# Patient Record
Sex: Female | Born: 1990 | Race: Black or African American | Hispanic: No | Marital: Single | Smoking: Current every day smoker
Health system: Southern US, Community
[De-identification: ages and names within clinical notes are randomized; demographics above are authoritative.]

## PROBLEM LIST (undated history)

## (undated) ENCOUNTER — Inpatient Hospital Stay (HOSPITAL_COMMUNITY): Payer: Self-pay

## (undated) DIAGNOSIS — Z206 Contact with and (suspected) exposure to human immunodeficiency virus [HIV]: Secondary | ICD-10-CM

## (undated) DIAGNOSIS — A6000 Herpesviral infection of urogenital system, unspecified: Secondary | ICD-10-CM

## (undated) DIAGNOSIS — D649 Anemia, unspecified: Secondary | ICD-10-CM

## (undated) HISTORY — PX: NO PAST SURGERIES: SHX2092

---
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
## 2009-03-11 IMAGING — US US OB TRANSVAGINAL MODIFY
1 series · 14 of 28 positions shown · non-contrast
Comparison: none

CLINICAL DATA: Abdominal pain. Quantitative beta HCG 889.  Positive
urine pregnancy test.  Last menstrual period 05/05/2008.
Gestational age by last menstrual period 4 weeks 0 days.

OBSTETRIC <14 WK US AND TRANSVAGINAL OB US
TECHNIQUE: Both transabdominal and transvaginal ultrasound
examinations were performed for complete evaluation of the
gestation as well as the maternal uterus, adnexal regions, and
pelvic cul-de-sac.

[Series 1: us ob comp less 14 wks · 14 of 38 slices shown]
[im 2/38]
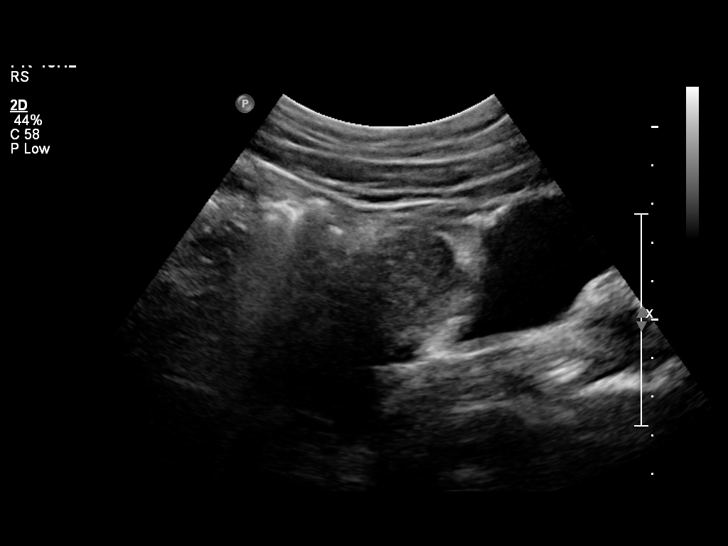
[im 5/38]
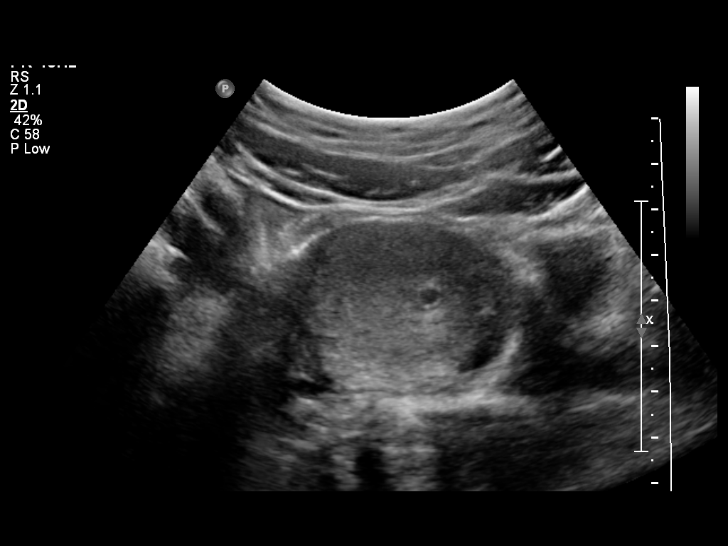
[im 7/38]
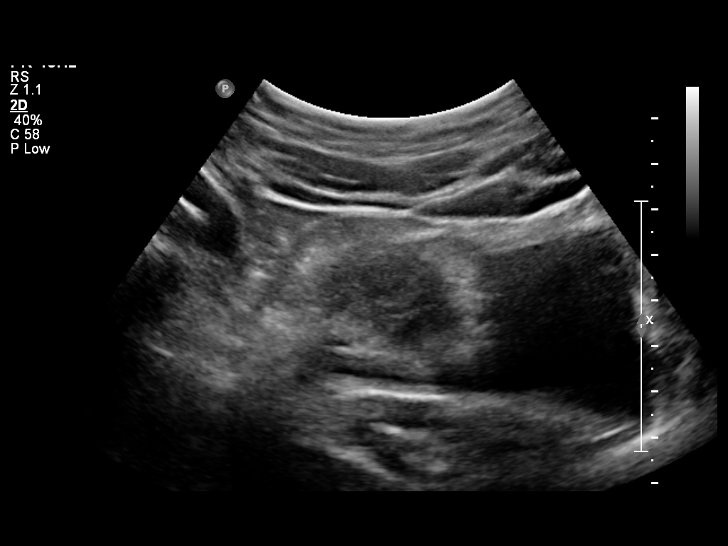
[im 10/38]
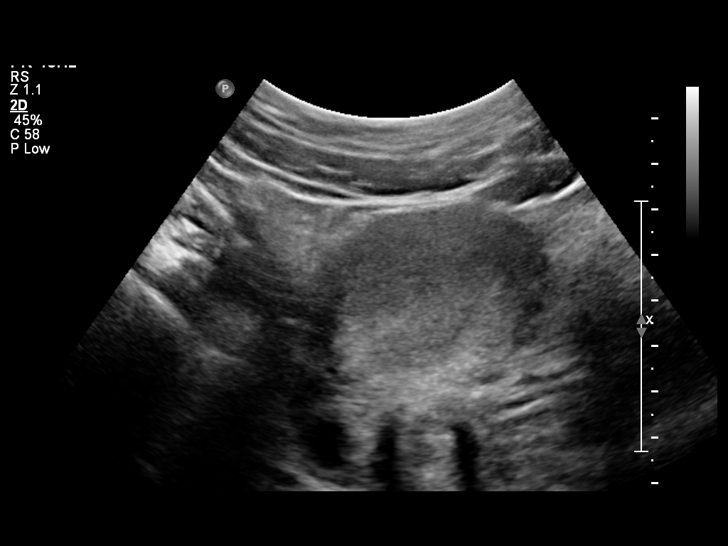
[im 13/38]
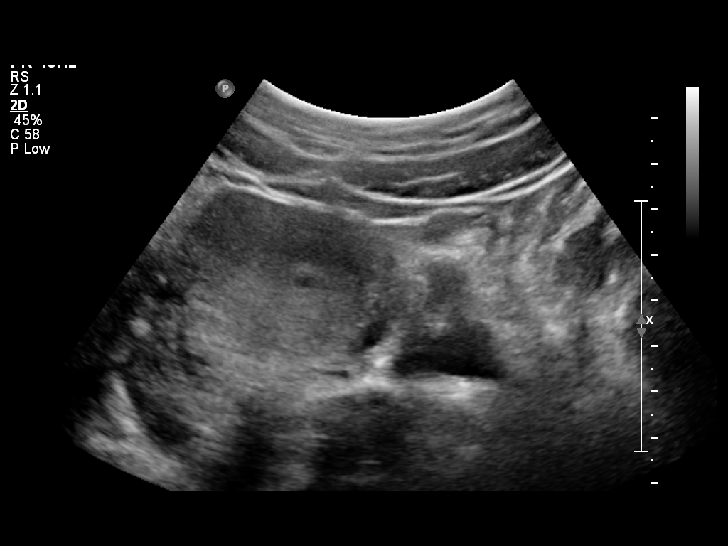
[im 16/38]
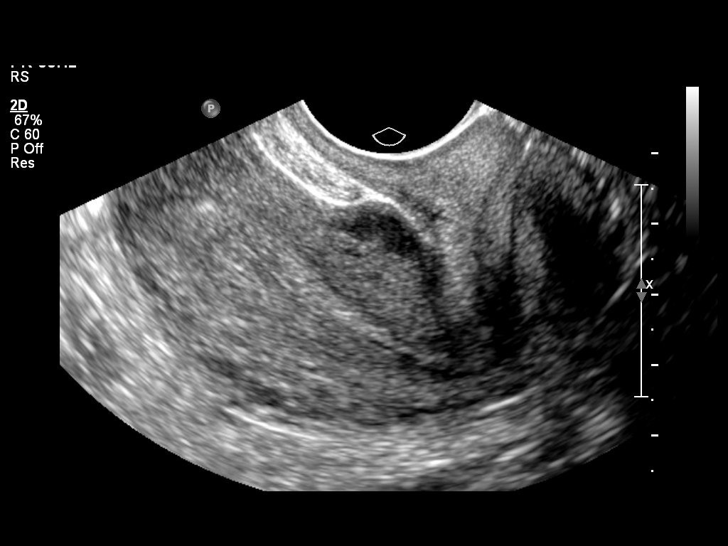
[im 18/38]
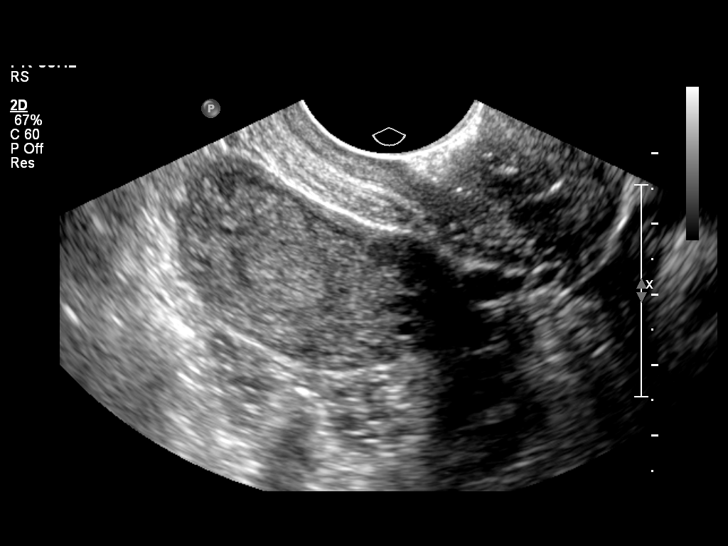
[im 21/38]
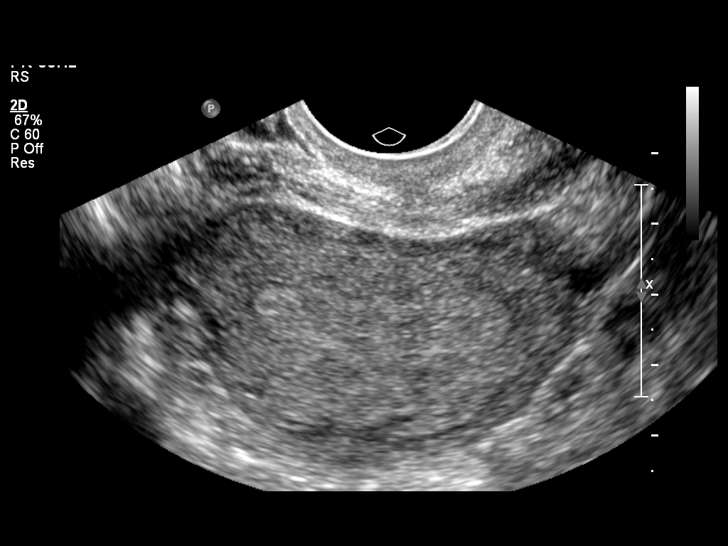
[im 24/38]
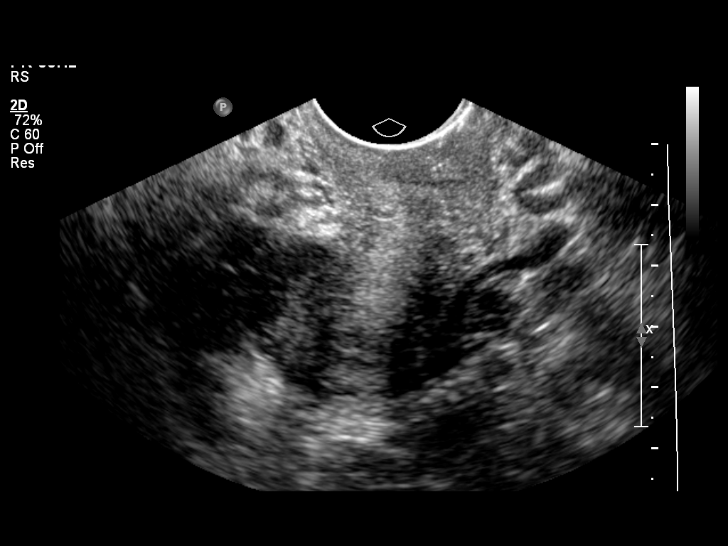
[im 27/38]
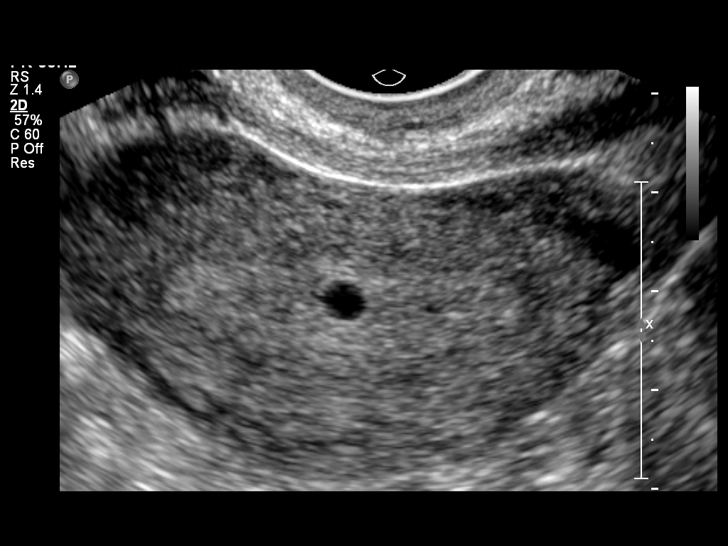
[im 29/38]
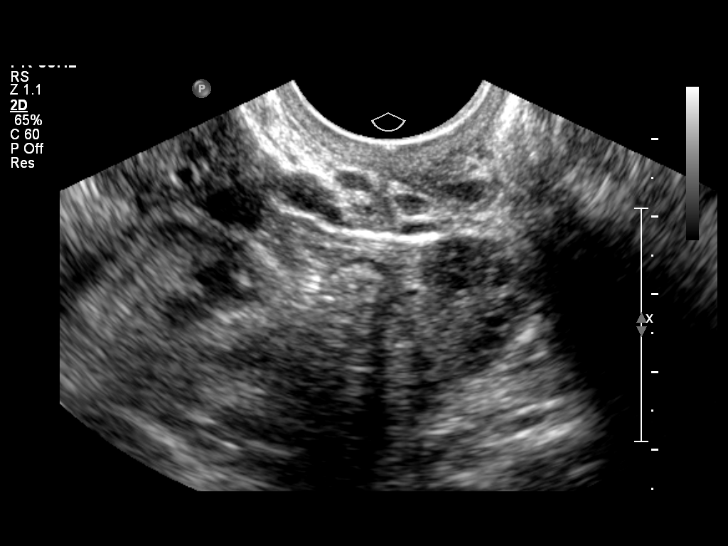
[im 32/38]
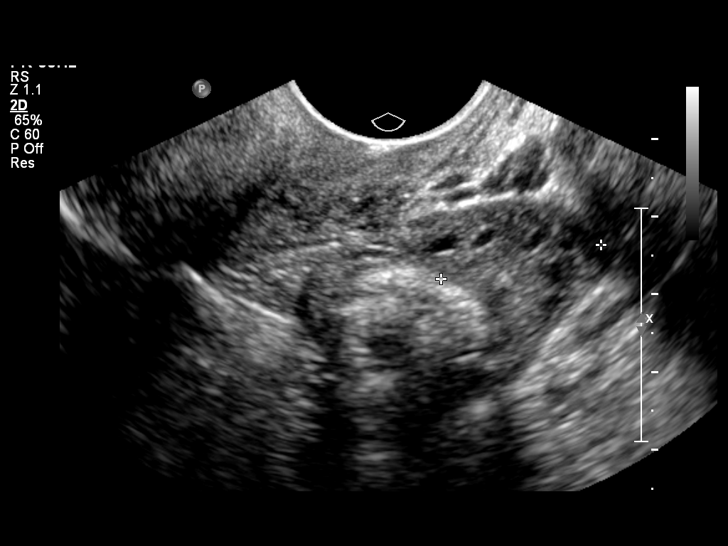
[im 35/38]
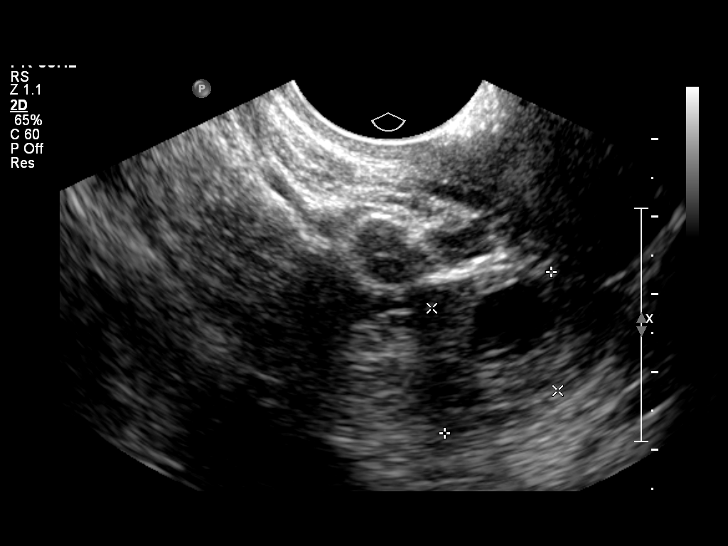
[im 38/38]
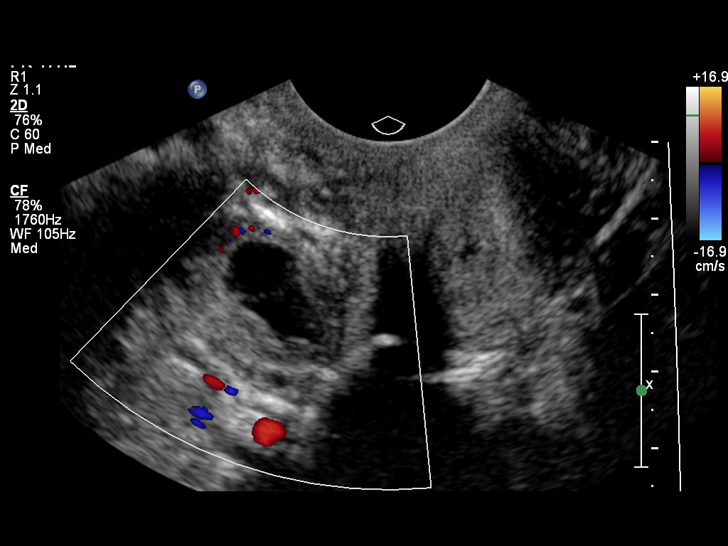

[14 of 28 positions shown; findings below may reference images not displayed]

FINDINGS: Single intrauterine gestational sac is identified.  No yolk sac,
embryo, or cardiac activity are identified.  The gestational sac
measures 3.9 mm, corresponding with 4 weeks 6 days.  At this small
size, it is too early to evaluate the needle characteristic or
viability.  No subchorionic hemorrhage.  Right ovary and left ovary
appear within normal limits.  Right ovarian corpus luteum cyst.  No
free fluid.
IMPRESSION: 1.  Tiny endometrial cyst may represent early gestational sac.  At
this quantitative beta HCG and with the ultrasound findings the
differential considerations are spontaneous abortion, early IUP
(normal or abnormal) or ectopic.  Follow-up is recommended.

## 2009-04-12 ENCOUNTER — Inpatient Hospital Stay (HOSPITAL_COMMUNITY): Admission: AD | Admit: 2009-04-12 | Discharge: 2009-04-12 | Payer: Self-pay | Admitting: Family Medicine

## 2009-04-12 ENCOUNTER — Ambulatory Visit: Payer: Self-pay | Admitting: Advanced Practice Midwife

## 2009-05-30 ENCOUNTER — Emergency Department (HOSPITAL_COMMUNITY): Admission: EM | Admit: 2009-05-30 | Discharge: 2009-05-31 | Payer: Self-pay | Admitting: Emergency Medicine

## 2009-06-13 ENCOUNTER — Inpatient Hospital Stay (HOSPITAL_COMMUNITY): Admission: AD | Admit: 2009-06-13 | Discharge: 2009-06-13 | Payer: Self-pay | Admitting: Obstetrics & Gynecology

## 2009-06-16 ENCOUNTER — Emergency Department (HOSPITAL_COMMUNITY): Admission: EM | Admit: 2009-06-16 | Discharge: 2009-06-16 | Payer: Self-pay | Admitting: Family Medicine

## 2009-06-16 ENCOUNTER — Inpatient Hospital Stay (HOSPITAL_COMMUNITY): Admission: AD | Admit: 2009-06-16 | Discharge: 2009-06-16 | Payer: Self-pay | Admitting: Obstetrics

## 2009-06-16 ENCOUNTER — Ambulatory Visit: Payer: Self-pay | Admitting: Obstetrics and Gynecology

## 2009-06-29 ENCOUNTER — Ambulatory Visit (HOSPITAL_COMMUNITY): Admission: RE | Admit: 2009-06-29 | Discharge: 2009-06-29 | Payer: Self-pay | Admitting: Obstetrics

## 2009-07-14 ENCOUNTER — Inpatient Hospital Stay (HOSPITAL_COMMUNITY): Admission: AD | Admit: 2009-07-14 | Discharge: 2009-07-14 | Payer: Self-pay | Admitting: Obstetrics

## 2010-04-07 IMAGING — US US OB DETAIL+14 WK
1 series · 14 of 28 positions shown · non-contrast
Comparison: none

OBSTETRICAL ULTRASOUND:
 This ultrasound exam was performed in the [HOSPITAL] Ultrasound Department.  The OB US report was generated in the AS system, and faxed to the ordering physician.  This report is also available in [HOSPITAL]?s [employer] and in [REDACTED] PACS.

[Series 1: us ob detail +14 wk · 0.16mm/px · 57 acquisitions, 14 frames shown]
[im 3/57]
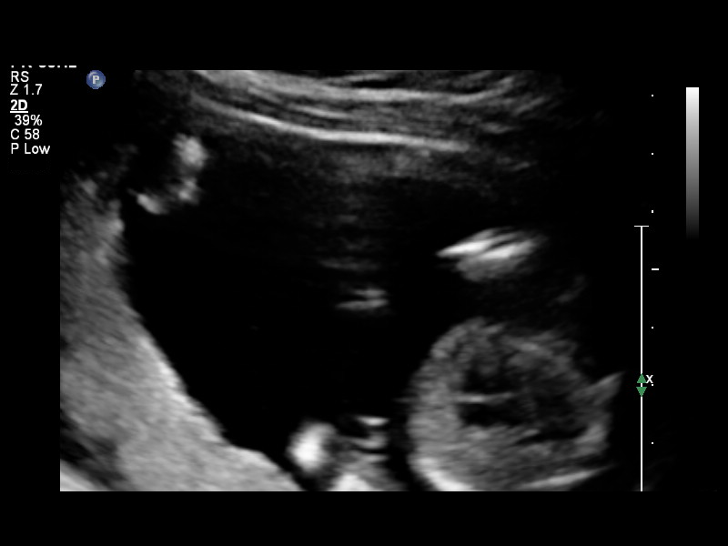
[im 7/57]
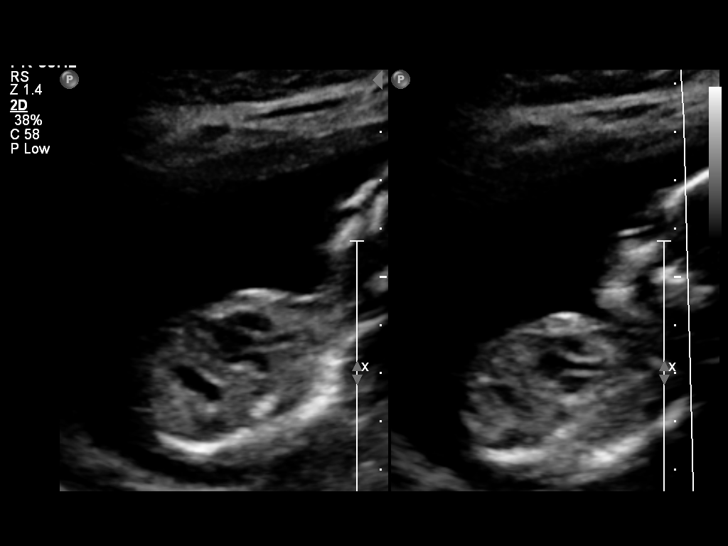
[im 11/57]
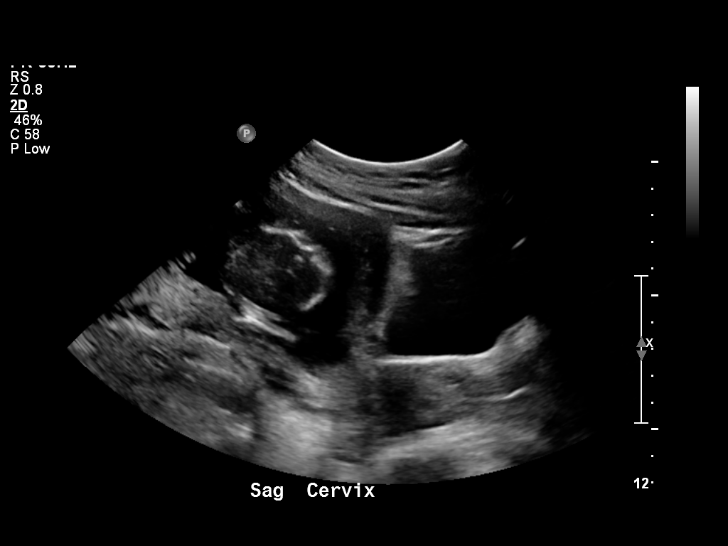
[im 15/57]
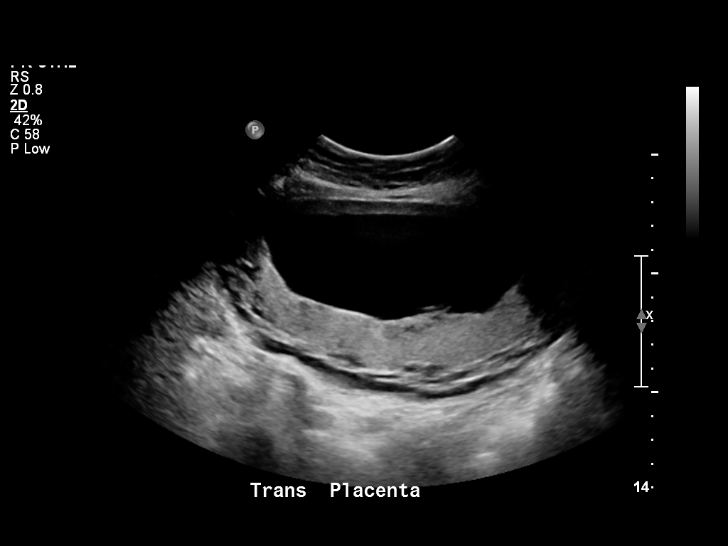
[im 19/57]
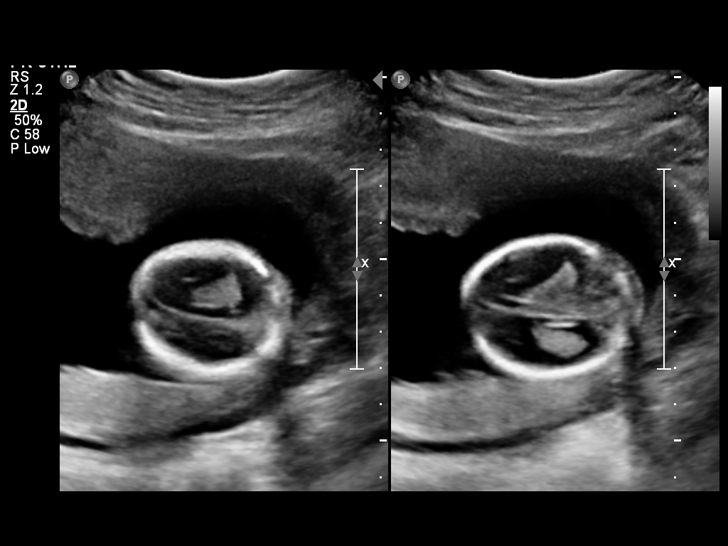
[im 23/57]
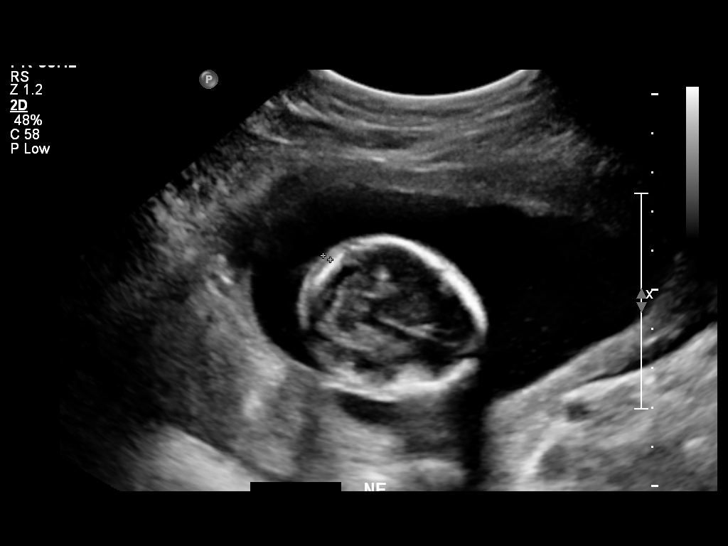
[im 27/57]
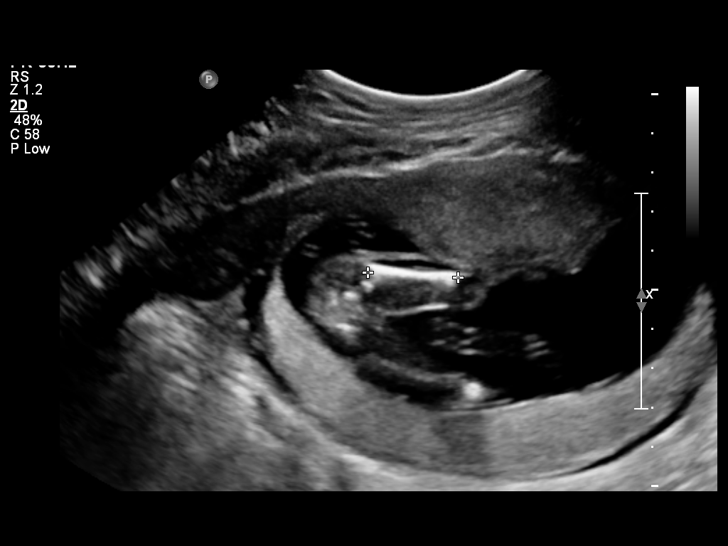
[im 32/57]
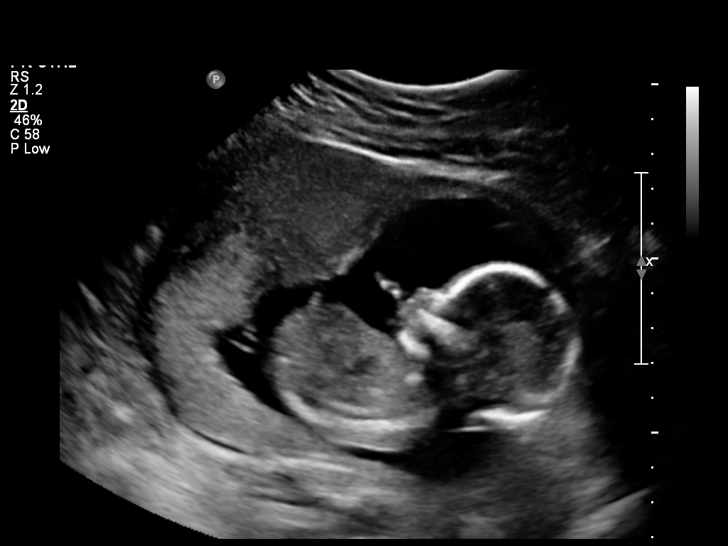
[im 36/57]
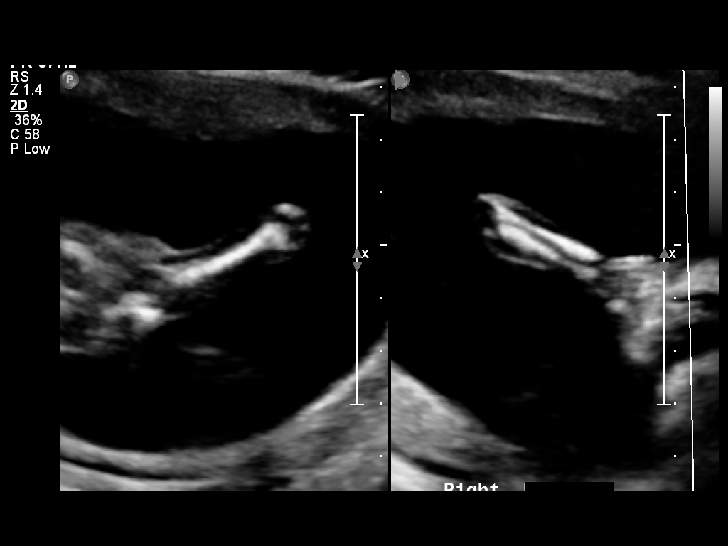
[im 40/57]
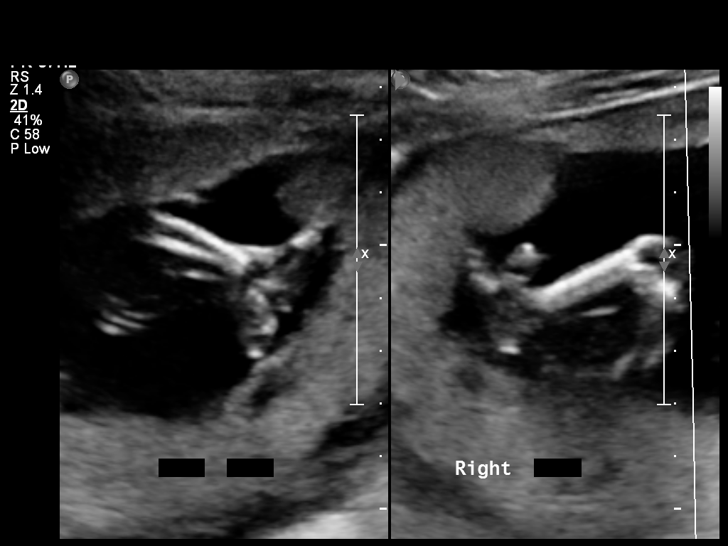
[im 44/57]
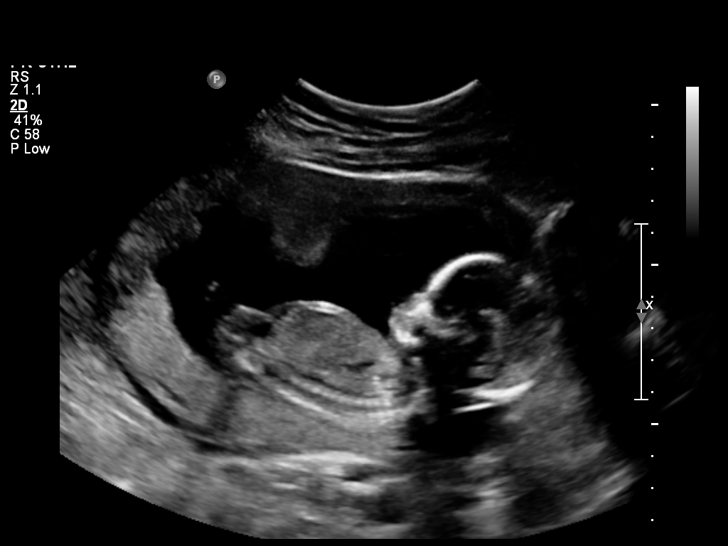
[im 48/57]
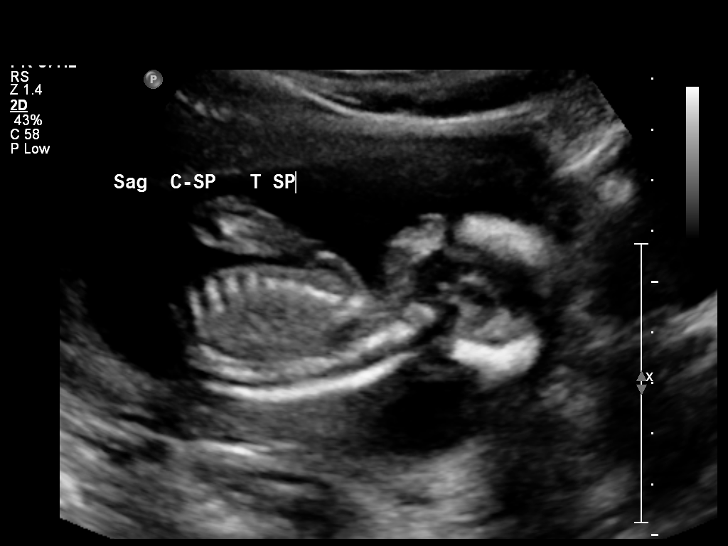
[im 52/57]
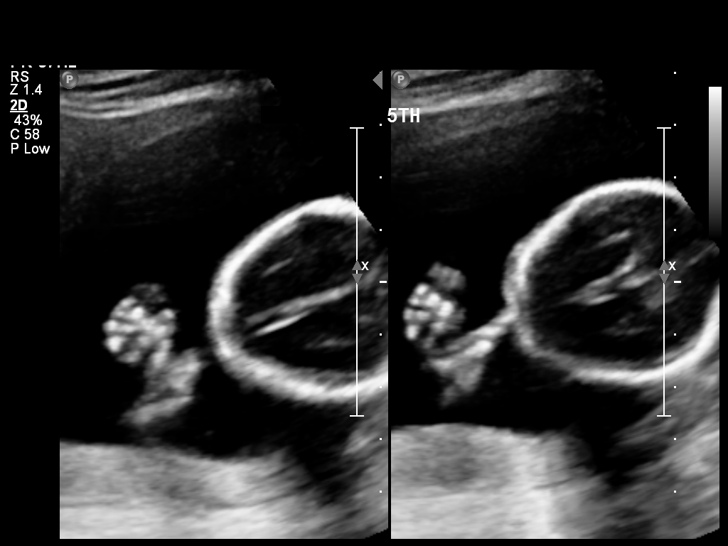
[im 57/57]
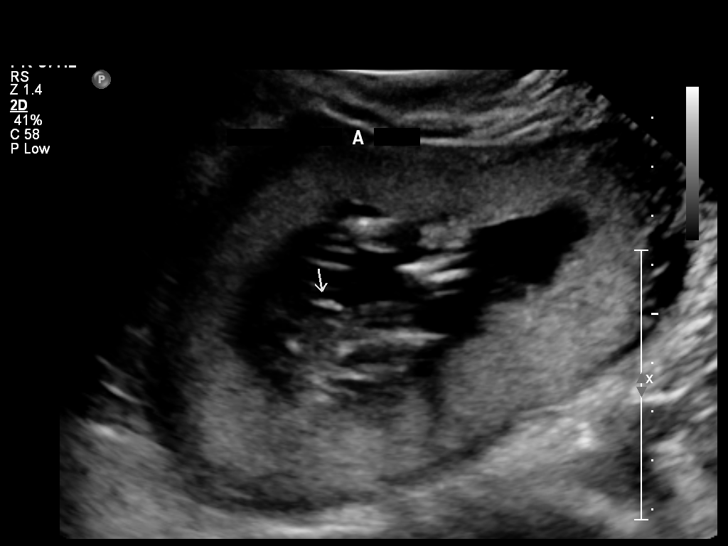

[14 of 28 positions shown; findings below may reference images not displayed]

IMPRESSION: See AS Obstetric US report.

## 2010-07-04 LAB — URINALYSIS, ROUTINE W REFLEX MICROSCOPIC
Bilirubin Urine: NEGATIVE
Ketones, ur: NEGATIVE mg/dL
Nitrite: NEGATIVE
Protein, ur: NEGATIVE mg/dL
Specific Gravity, Urine: 1.012 (ref 1.005–1.030)
Urobilinogen, UA: 0.2 mg/dL (ref 0.0–1.0)

## 2010-07-04 LAB — HERPES SIMPLEX VIRUS CULTURE: Culture: DETECTED

## 2010-07-04 LAB — WET PREP, GENITAL

## 2010-07-04 LAB — GC/CHLAMYDIA PROBE AMP, GENITAL
Chlamydia, DNA Probe: NEGATIVE
Chlamydia, DNA Probe: NEGATIVE
GC Probe Amp, Genital: NEGATIVE

## 2010-07-04 LAB — URINE MICROSCOPIC-ADD ON

## 2010-07-04 LAB — POCT PREGNANCY, URINE: Preg Test, Ur: POSITIVE

## 2010-07-04 LAB — HCG, QUANTITATIVE, PREGNANCY: hCG, Beta Chain, Quant, S: >200000 m[IU]/mL — ABNORMAL HIGH (ref <5)

## 2010-07-04 LAB — RPR: RPR Ser Ql: NONREACTIVE

## 2010-07-08 LAB — POCT URINALYSIS DIP (DEVICE)
Bilirubin Urine: NEGATIVE
Glucose, UA: 250 mg/dL — AB
Hgb urine dipstick: NEGATIVE
Nitrite: NEGATIVE
Urobilinogen, UA: 0.2 mg/dL (ref 0.0–1.0)

## 2010-07-08 LAB — GLUCOSE, CAPILLARY: Glucose-Capillary: 99 mg/dL (ref 70–99)

## 2010-07-08 LAB — GC/CHLAMYDIA PROBE AMP, GENITAL
Chlamydia, DNA Probe: NEGATIVE
GC Probe Amp, Genital: NEGATIVE

## 2010-07-08 LAB — URINALYSIS, ROUTINE W REFLEX MICROSCOPIC
Bilirubin Urine: NEGATIVE
Glucose, UA: NEGATIVE mg/dL
Hgb urine dipstick: NEGATIVE
Ketones, ur: NEGATIVE mg/dL
Protein, ur: NEGATIVE mg/dL
Specific Gravity, Urine: 1.01 (ref 1.005–1.030)
Urobilinogen, UA: 0.2 mg/dL (ref 0.0–1.0)
pH: 5.5 (ref 5.0–8.0)

## 2010-07-08 LAB — WET PREP, GENITAL
Clue Cells Wet Prep HPF POC: NONE SEEN
Trich, Wet Prep: NONE SEEN

## 2010-07-08 LAB — URINE MICROSCOPIC-ADD ON: RBC / HPF: NONE SEEN RBC/hpf (ref <3)

## 2010-07-16 LAB — URINALYSIS, ROUTINE W REFLEX MICROSCOPIC
Bilirubin Urine: NEGATIVE
Glucose, UA: NEGATIVE mg/dL
Hgb urine dipstick: NEGATIVE
Specific Gravity, Urine: <1.005 — ABNORMAL LOW (ref 1.005–1.030)
pH: 7 (ref 5.0–8.0)

## 2010-07-16 LAB — WET PREP, GENITAL
Trich, Wet Prep: NONE SEEN
Yeast Wet Prep HPF POC: NONE SEEN

## 2010-07-16 LAB — POCT PREGNANCY, URINE: Preg Test, Ur: POSITIVE

## 2010-07-16 LAB — GC/CHLAMYDIA PROBE AMP, GENITAL
Chlamydia, DNA Probe: NEGATIVE
GC Probe Amp, Genital: NEGATIVE

## 2010-07-26 LAB — URINALYSIS, ROUTINE W REFLEX MICROSCOPIC
Ketones, ur: NEGATIVE mg/dL
Nitrite: NEGATIVE
Specific Gravity, Urine: <1.005 — ABNORMAL LOW (ref 1.005–1.030)
pH: 6.5 (ref 5.0–8.0)

## 2010-07-26 LAB — POCT PREGNANCY, URINE: Preg Test, Ur: NEGATIVE

## 2010-07-26 LAB — HCG, QUANTITATIVE, PREGNANCY: hCG, Beta Chain, Quant, S: <2 m[IU]/mL (ref <5)

## 2010-07-30 LAB — URINALYSIS, ROUTINE W REFLEX MICROSCOPIC
Hgb urine dipstick: NEGATIVE
Protein, ur: NEGATIVE mg/dL
Urobilinogen, UA: 0.2 mg/dL (ref 0.0–1.0)

## 2010-07-31 LAB — URINALYSIS, ROUTINE W REFLEX MICROSCOPIC
Bilirubin Urine: NEGATIVE
Glucose, UA: NEGATIVE mg/dL
Glucose, UA: NEGATIVE mg/dL
Hgb urine dipstick: NEGATIVE
Ketones, ur: NEGATIVE mg/dL
Protein, ur: NEGATIVE mg/dL
Specific Gravity, Urine: 1.015 (ref 1.005–1.030)
Urobilinogen, UA: 0.2 mg/dL (ref 0.0–1.0)
pH: 6 (ref 5.0–8.0)

## 2010-07-31 LAB — RH IMMUNE GLOBULIN WORKUP (NOT WOMEN'S HOSP): Antibody Screen: NEGATIVE

## 2010-07-31 LAB — CBC
Hemoglobin: 12.9 g/dL (ref 12.0–15.0)
Hemoglobin: 13.3 g/dL (ref 12.0–15.0)
MCHC: 33 g/dL (ref 30.0–36.0)
RBC: 4.65 MIL/uL (ref 3.87–5.11)
RBC: 4.82 MIL/uL (ref 3.87–5.11)
RDW: 14.1 % (ref 11.5–15.5)
WBC: 8.8 10*3/uL (ref 4.0–10.5)

## 2010-07-31 LAB — GC/CHLAMYDIA PROBE AMP, GENITAL
Chlamydia, DNA Probe: NEGATIVE
GC Probe Amp, Genital: NEGATIVE

## 2010-07-31 LAB — WET PREP, GENITAL: Trich, Wet Prep: NONE SEEN

## 2010-07-31 LAB — URINE MICROSCOPIC-ADD ON

## 2010-07-31 LAB — ABO/RH
ABO/RH(D): B NEG
Weak D: NEGATIVE

## 2010-07-31 LAB — HCG, QUANTITATIVE, PREGNANCY
hCG, Beta Chain, Quant, S: 595 m[IU]/mL — ABNORMAL HIGH (ref <5)
hCG, Beta Chain, Quant, S: 889 m[IU]/mL — ABNORMAL HIGH (ref <5)

## 2011-01-15 LAB — URINALYSIS, ROUTINE W REFLEX MICROSCOPIC
Bilirubin Urine: NEGATIVE
Ketones, ur: NEGATIVE
Leukocytes, UA: NEGATIVE
Nitrite: NEGATIVE
Protein, ur: NEGATIVE
Urobilinogen, UA: 0.2
pH: 6.5

## 2011-01-15 LAB — WET PREP, GENITAL

## 2011-01-15 LAB — POCT PREGNANCY, URINE
Preg Test, Ur: NEGATIVE
Preg Test, Ur: NEGATIVE

## 2015-12-15 DIAGNOSIS — Z206 Contact with and (suspected) exposure to human immunodeficiency virus [HIV]: Secondary | ICD-10-CM

## 2015-12-15 HISTORY — DX: Contact with and (suspected) exposure to human immunodeficiency virus (hiv): Z20.6

## 2015-12-23 ENCOUNTER — Emergency Department (HOSPITAL_COMMUNITY)
Admission: EM | Admit: 2015-12-23 | Discharge: 2015-12-23 | Discharge status: Against Medical Advice | Payer: Medicaid Other | Attending: Dermatology | Admitting: Dermatology

## 2015-12-23 ENCOUNTER — Encounter (HOSPITAL_COMMUNITY): Payer: Self-pay | Admitting: Emergency Medicine

## 2015-12-23 ENCOUNTER — Emergency Department (HOSPITAL_COMMUNITY): Payer: Medicaid Other

## 2015-12-23 DIAGNOSIS — R112 Nausea with vomiting, unspecified: Secondary | ICD-10-CM | POA: Insufficient documentation

## 2015-12-23 DIAGNOSIS — F17200 Nicotine dependence, unspecified, uncomplicated: Secondary | ICD-10-CM | POA: Insufficient documentation

## 2015-12-23 DIAGNOSIS — Z5321 Procedure and treatment not carried out due to patient leaving prior to being seen by health care provider: Secondary | ICD-10-CM | POA: Insufficient documentation

## 2015-12-23 HISTORY — DX: Herpesviral infection of urogenital system, unspecified: A60.00

## 2015-12-23 LAB — COMPREHENSIVE METABOLIC PANEL
ALBUMIN: 4 g/dL (ref 3.5–5.0)
ALT: 14 U/L (ref 14–54)
ANION GAP: 10 (ref 5–15)
AST: 21 U/L (ref 15–41)
Alkaline Phosphatase: 46 U/L (ref 38–126)
BILIRUBIN TOTAL: 0.5 mg/dL (ref 0.3–1.2)
CHLORIDE: 101 mmol/L (ref 101–111)
CO2: 26 mmol/L (ref 22–32)
Calcium: 10 mg/dL (ref 8.9–10.3)
Creatinine, Ser: 0.74 mg/dL (ref 0.44–1.00)
GFR calc Af Amer: >60 mL/min (ref >60)
GFR calc non Af Amer: >60 mL/min (ref >60)
GLUCOSE: 88 mg/dL (ref 65–99)
POTASSIUM: 4 mmol/L (ref 3.5–5.1)
SODIUM: 137 mmol/L (ref 135–145)
TOTAL PROTEIN: 7.1 g/dL (ref 6.5–8.1)

## 2015-12-23 LAB — CBC
HEMATOCRIT: 40 % (ref 36.0–46.0)
HEMOGLOBIN: 13.7 g/dL (ref 12.0–15.0)
MCH: 27.3 pg (ref 26.0–34.0)
MCHC: 34.3 g/dL (ref 30.0–36.0)
MCV: 79.7 fL (ref 78.0–100.0)
Platelets: 334 10*3/uL (ref 150–400)
RBC: 5.02 MIL/uL (ref 3.87–5.11)
RDW: 13.8 % (ref 11.5–15.5)
WBC: 10.1 10*3/uL (ref 4.0–10.5)

## 2015-12-23 LAB — URINALYSIS, ROUTINE W REFLEX MICROSCOPIC
BILIRUBIN URINE: NEGATIVE
GLUCOSE, UA: NEGATIVE mg/dL
Hgb urine dipstick: NEGATIVE
KETONES UR: 15 mg/dL — AB
Leukocytes, UA: NEGATIVE
NITRITE: NEGATIVE
PH: 7 (ref 5.0–8.0)
Protein, ur: NEGATIVE mg/dL
SPECIFIC GRAVITY, URINE: 1.013 (ref 1.005–1.030)

## 2015-12-23 LAB — I-STAT TROPONIN, ED: TROPONIN I, POC: 0 ng/mL (ref 0.00–0.08)

## 2015-12-23 LAB — LIPASE, BLOOD: Lipase: 19 U/L (ref 11–51)

## 2015-12-23 LAB — POC URINE PREG, ED: Preg Test, Ur: POSITIVE — AB

## 2015-12-23 NOTE — ED Triage Notes (Signed)
Pt being incredibly disruptively in the waiting room. Pt becoming verbally aggressive, yelling at staff, cursing, threatening to run into the acute side of ED and find a doctor. Staff and security attempted therapeutic communication to calm pt down. Pt continued to yell and disrupt. Pt eventually escorted out of ED on her own free will with [employer] following behind.

## 2015-12-23 NOTE — ED Triage Notes (Signed)
Patient with nausea and vomiting for the last week.  Patient states that she might have a chance of being pregnant.  She states that she is dizzy sitting up and having a hard time keeping anything down.  She states that she have tightness in her abdomen and burning in her chest.

## 2016-01-19 ENCOUNTER — Encounter (HOSPITAL_COMMUNITY): Payer: Self-pay | Admitting: *Deleted

## 2016-01-19 ENCOUNTER — Inpatient Hospital Stay (HOSPITAL_COMMUNITY)
Admission: AD | Admit: 2016-01-19 | Discharge: 2016-01-19 | Disposition: A | Discharge status: Home / Self Care | Payer: Medicaid Other | Source: Ambulatory Visit | Attending: Obstetrics & Gynecology | Admitting: Obstetrics & Gynecology

## 2016-01-19 DIAGNOSIS — O219 Vomiting of pregnancy, unspecified: Secondary | ICD-10-CM

## 2016-01-19 DIAGNOSIS — F17290 Nicotine dependence, other tobacco product, uncomplicated: Secondary | ICD-10-CM | POA: Insufficient documentation

## 2016-01-19 DIAGNOSIS — Z3A16 16 weeks gestation of pregnancy: Secondary | ICD-10-CM | POA: Insufficient documentation

## 2016-01-19 DIAGNOSIS — O23592 Infection of other part of genital tract in pregnancy, second trimester: Secondary | ICD-10-CM | POA: Insufficient documentation

## 2016-01-19 DIAGNOSIS — O99332 Smoking (tobacco) complicating pregnancy, second trimester: Secondary | ICD-10-CM | POA: Diagnosis not present

## 2016-01-19 DIAGNOSIS — R1030 Lower abdominal pain, unspecified: Secondary | ICD-10-CM | POA: Diagnosis present

## 2016-01-19 DIAGNOSIS — O210 Mild hyperemesis gravidarum: Secondary | ICD-10-CM | POA: Insufficient documentation

## 2016-01-19 DIAGNOSIS — N760 Acute vaginitis: Secondary | ICD-10-CM

## 2016-01-19 HISTORY — DX: Contact with and (suspected) exposure to human immunodeficiency virus (hiv): Z20.6

## 2016-01-19 HISTORY — DX: Anemia, unspecified: D64.9

## 2016-01-19 LAB — URINALYSIS, ROUTINE W REFLEX MICROSCOPIC
BILIRUBIN URINE: NEGATIVE
GLUCOSE, UA: NEGATIVE mg/dL
HGB URINE DIPSTICK: NEGATIVE
Ketones, ur: 40 mg/dL — AB
Leukocytes, UA: NEGATIVE
Nitrite: NEGATIVE
PH: 6 (ref 5.0–8.0)
Protein, ur: NEGATIVE mg/dL
SPECIFIC GRAVITY, URINE: 1.01 (ref 1.005–1.030)

## 2016-01-19 LAB — WET PREP, GENITAL
Clue Cells Wet Prep HPF POC: NONE SEEN
SPERM: NONE SEEN
Trich, Wet Prep: NONE SEEN
Yeast Wet Prep HPF POC: NONE SEEN

## 2016-01-19 MED ORDER — ONDANSETRON 4 MG PO TBDP: 4.0000 mg | ORAL_TABLET | Freq: Four times a day (QID) | ORAL | 2 refills | Status: DC | PRN

## 2016-01-19 MED ORDER — PREPLUS 27-1 MG PO TABS: 1.0000 | ORAL_TABLET | Freq: Every day | ORAL | 13 refills | Status: DC

## 2016-01-19 MED ORDER — ONDANSETRON 4 MG PO TBDP
4.0000 mg | ORAL_TABLET | Freq: Once | ORAL | Status: AC
  Administered 2016-01-19: 4 mg via ORAL
  Filled 2016-01-19: qty 1

## 2016-01-19 NOTE — Progress Notes (Signed)
Written and verbal d/c instructions given and understanding voiced. 

## 2016-01-19 NOTE — MAU Note (Signed)
Pt states she has been having abd pain vomiting/diarrhea x 1.5 months.  Pt states she has been having unprotected sex with partner who just found out he is HIV positive.  Pt wanted to be checked out.  Vaginal burning/itching with yellowish chunky discharge with bad smell for 2 weeks.  No bleeding.

## 2016-01-19 NOTE — Discharge Instructions (Signed)
Morning Sickness Morning sickness is when you feel sick to your stomach (nauseous) during pregnancy. This nauseous feeling may or may not come with vomiting. It often occurs in the morning but can be a problem any time of day. Morning sickness is most common during the first trimester, but it may continue throughout pregnancy. While morning sickness is unpleasant, it is usually harmless unless you develop severe and continual vomiting (hyperemesis gravidarum). This condition requires more intense treatment.  CAUSES  The cause of morning sickness is not completely known but seems to be related to normal hormonal changes that occur in pregnancy. RISK FACTORS You are at greater risk if you:  Experienced nausea or vomiting before your pregnancy.  Had morning sickness during a previous pregnancy.  Are pregnant with more than one baby, such as twins. TREATMENT  Do not use any medicines (prescription, over-the-counter, or herbal) for morning sickness without first talking to your health care provider. Your health care provider may prescribe or recommend:  Vitamin B6 supplements.  Anti-nausea medicines.  The herbal medicine ginger. HOME CARE INSTRUCTIONS   Only take over-the-counter or prescription medicines as directed by your health care provider.  Taking multivitamins before getting pregnant can prevent or decrease the severity of morning sickness in most women.  Eat a piece of dry toast or unsalted crackers before getting out of bed in the morning.  Eat five or six small meals a day.  Eat dry and bland foods (rice, baked potato). Foods high in carbohydrates are often helpful.  Do not drink liquids with your meals. Drink liquids between meals.  Avoid greasy, fatty, and spicy foods.  Get someone to cook for you if the smell of any food causes nausea and vomiting.  If you feel nauseous after taking prenatal vitamins, take the vitamins at night or with a snack.  Snack on protein  foods (nuts, yogurt, cheese) between meals if you are hungry.  Eat unsweetened gelatins for desserts.  Wearing an acupressure wristband (worn for sea sickness) may be helpful.  Acupuncture may be helpful.  Do not smoke.  Get a humidifier to keep the air in your house free of odors.  Get plenty of fresh air. SEEK MEDICAL CARE IF:   Your home remedies are not working, and you need medicine.  You feel dizzy or lightheaded.  You are losing weight. SEEK IMMEDIATE MEDICAL CARE IF:   You have persistent and uncontrolled nausea and vomiting.  You pass out (faint). MAKE SURE YOU:  Understand these instructions.  Will watch your condition.  Will get help right away if you are not doing well or get worse.   This information is not intended to replace advice given to you by your health care provider. Make sure you discuss any questions you have with your health care provider.   Document Released: 05/23/2006 Document Revised: 04/06/2013 Document Reviewed: 09/16/2012 Elsevier Interactive Patient Education 2016 [employer].    Vaginitis Vaginitis is an inflammation of the vagina. It is most often caused by a change in the normal balance of the bacteria and yeast that live in the vagina. This change in balance causes an overgrowth of certain bacteria or yeast, which causes the inflammation. There are different types of vaginitis, but the most common types are:  Bacterial vaginosis.  Yeast infection (candidiasis).  Trichomoniasis vaginitis. This is a sexually transmitted infection (STI).  Viral vaginitis.  Atrophic vaginitis.  Allergic vaginitis. CAUSES  The cause depends on the type of vaginitis. Vaginitis can be  caused by:  Bacteria (bacterial vaginosis).  Yeast (yeast infection).  A parasite (trichomoniasis vaginitis)  A virus (viral vaginitis).  Low hormone levels (atrophic vaginitis). Low hormone levels can occur during pregnancy, breastfeeding, or after  menopause.  Irritants, such as bubble baths, scented tampons, and feminine sprays (allergic vaginitis). Other factors can change the normal balance of the yeast and bacteria that live in the vagina. These include:  Antibiotic medicines.  Poor hygiene.  Diaphragms, vaginal sponges, spermicides, birth control pills, and intrauterine devices (IUD).  Sexual intercourse.  Infection.  Uncontrolled diabetes.  A weakened immune system. SYMPTOMS  Symptoms can vary depending on the cause of the vaginitis. Common symptoms include:  Abnormal vaginal discharge.  The discharge is white, gray, or yellow with bacterial vaginosis.  The discharge is thick, white, and cheesy with a yeast infection.  The discharge is frothy and yellow or greenish with trichomoniasis.  A bad vaginal odor.  The odor is fishy with bacterial vaginosis.  Vaginal itching, pain, or swelling.  Painful intercourse.  Pain or burning when urinating. Sometimes, there are no symptoms. TREATMENT  Treatment will vary depending on the type of infection.   Bacterial vaginosis and trichomoniasis are often treated with antibiotic creams or pills.  Yeast infections are often treated with antifungal medicines, such as vaginal creams or suppositories.  Viral vaginitis has no cure, but symptoms can be treated with medicines that relieve discomfort. Your sexual partner should be treated as well.  Atrophic vaginitis may be treated with an estrogen cream, pill, suppository, or vaginal ring. If vaginal dryness occurs, lubricants and moisturizing creams may help. You may be told to avoid scented soaps, sprays, or douches.  Allergic vaginitis treatment involves quitting the use of the product that is causing the problem. Vaginal creams can be used to treat the symptoms. HOME CARE INSTRUCTIONS   Take all medicines as directed by your caregiver.  Keep your genital area clean and dry. Avoid soap and only rinse the area with  water.  Avoid douching. It can remove the healthy bacteria in the vagina.  Do not use tampons or have sexual intercourse until your vaginitis has been treated. Use sanitary pads while you have vaginitis.  Wipe from front to back. This avoids the spread of bacteria from the rectum to the vagina.  Let air reach your genital area.  Wear cotton underwear to decrease moisture buildup.  Avoid wearing underwear while you sleep until your vaginitis is gone.  Avoid tight pants and underwear or nylons without a cotton panel.  Take off wet clothing (especially bathing suits) as soon as possible.  Use mild, non-scented products. Avoid using irritants, such as:  Scented feminine sprays.  Fabric softeners.  Scented detergents.  Scented tampons.  Scented soaps or bubble baths.  Practice safe sex and use condoms. Condoms may prevent the spread of trichomoniasis and viral vaginitis. SEEK MEDICAL CARE IF:   You have abdominal pain.  You have a fever or persistent symptoms for more than 2-3 days.  You have a fever and your symptoms suddenly get worse.   This information is not intended to replace advice given to you by your health care provider. Make sure you discuss any questions you have with your health care provider.   Document Released: 01/27/2007 Document Revised: 08/16/2014 Document Reviewed: 09/12/2011 Elsevier Interactive Patient Education [employer]2016 Elsevier Inc.

## 2016-01-19 NOTE — MAU Provider Note (Signed)
[facility] Attending Note  History     CSN: [id number]  Arrival date & time 01/19/16  1759   First Provider Initiated Contact with Patient 01/19/16 1849      Chief Complaint  Patient presents with  . Abdominal Pain  . Diarrhea  . Emesis During Pregnancy    Cheryl DowningMonissa Mcavoy is a 25 y.o. [id number] at 4173w0d who presents to MAU today for evaluation of lower abdominal pain, emesis and diarrhea that occurs off and on since she found out she was pregnant 1.5 months ago. Currently, no pain, or diarrhea, just had nausea.  Also reports vulvar itching and irritation for 2 week, with yellow discharge.  Denies any fevers, chills, sweats, dysuria, other GI or GU symptoms or other general symptoms.   Obstetric History   G7   P3   T2   P1   A2   L2    SAB1   TAB0   Ectopic0   Multiple0   Live Births0     # Outcome Date GA Lbr Len/2nd Weight Sex Delivery Anes PTL Lv  7 Current           6 Gravida           5 TAB           4 Preterm           3 Term           2 Term           1 SAB               Past Medical History:  Diagnosis Date  . Anemia   . Herpes genitalia   . HIV exposure 12/2015    Past Surgical History:  Procedure Laterality Date  . NO PAST SURGERIES      History reviewed. No pertinent family history.  Social History  Substance Use Topics  . Smoking status: Current Every Day Smoker    Years: 10.00    Types: Cigars  . Smokeless tobacco: Never Used  . Alcohol use Yes    No Known Allergies  No prescriptions prior to admission.     Physical Exam  BP 122/66 (BP Location: Right Arm)   Pulse 104   Temp 98.8 F (37.1 C)   Resp 18   LMP 09/29/2015 (Approximate)   SpO2 98%   FHT 160 bpm GENERAL: Well-developed, well-nourished female in no acute distress  SKIN: Warm, dry and without erythema PSYCH: Normal mood and affect HEENT: Normocephalic, atraumatic.   LUNGS: Normal respiratory effort, normal breath sounds HEART: Regular rate noted ABDOMEN:  Soft, nondistended, nontender, gravid PELVIC: NEFG. Thin white discharge seen, wet prep and cultures obtained. EXTREMITIES: No edema, no cyanosis, normal range of movement  MAU Course/MDM  Zofran ODT 4 mg given. Wet prep and STI screen obtained.  Labs and Imaging   Results for orders placed or performed during the hospital encounter of 01/19/16 (from the past 24 hour(s))  Urinalysis, Routine w reflex microscopic (not at [facility])     Status: Abnormal   Collection Time: 01/19/16  6:05 PM  Result Value Ref Range   Color, Urine YELLOW YELLOW   APPearance CLEAR CLEAR   Specific Gravity, Urine 1.010 1.005 - 1.030   pH 6.0 5.0 - 8.0   Glucose, UA NEGATIVE NEGATIVE mg/dL   Hgb urine dipstick NEGATIVE NEGATIVE   Bilirubin Urine NEGATIVE NEGATIVE   Ketones, ur 40 (A) NEGATIVE mg/dL   Protein, ur  NEGATIVE NEGATIVE mg/dL   Nitrite NEGATIVE NEGATIVE   Leukocytes, UA NEGATIVE NEGATIVE  Wet prep, genital     Status: Abnormal   Collection Time: 01/19/16  6:55 PM  Result Value Ref Range   Yeast Wet Prep HPF POC NONE SEEN NONE SEEN   Trich, Wet Prep NONE SEEN NONE SEEN   Clue Cells Wet Prep HPF POC NONE SEEN NONE SEEN   WBC, Wet Prep HPF POC FEW (A) NONE SEEN   Sperm NONE SEEN    No results found.  Assessment and Plan   1. Nausea and vomiting during pregnancy prior to [redacted] weeks gestation   2. Vulvovaginitis     IUP at [redacted]w[redacted]d Prenatal vitamins given Zofran ODT prescribed. She was encouraged to sign up for MyChart to get her results; she will be notified for any abnormal STI screen results.  Safe sex advised.  Was told to return to MAU for any pain, bleeding or other concerns, or if her condition were to change or worsen. Discharged to home in stable condition      Medication List    TAKE these medications   ondansetron 4 MG disintegrating tablet Commonly known as:  ZOFRAN ODT Take 1 tablet (4 mg total) by mouth every 6 (six) hours as needed for nausea.   PREPLUS 27-1 MG Tabs Take  1 tablet by mouth daily.        Jaynie Collins, MD, FACOG Attending Obstetrician & Gynecologist, [facility] [employer], [facility]

## 2016-01-20 LAB — RPR: RPR Ser Ql: NONREACTIVE

## 2016-01-20 LAB — HIV ANTIBODY (ROUTINE TESTING W REFLEX): HIV Screen 4th Generation wRfx: NONREACTIVE

## 2016-01-22 LAB — GC/CHLAMYDIA PROBE AMP (~~LOC~~) NOT AT ARMC
CHLAMYDIA, DNA PROBE: NEGATIVE
NEISSERIA GONORRHEA: NEGATIVE

## 2016-02-22 LAB — OB RESULTS CONSOLE HEPATITIS B SURFACE ANTIGEN: Hepatitis B Surface Ag: NEGATIVE

## 2016-02-22 LAB — CYSTIC FIBROSIS DIAGNOSTIC STUDY: INTERPRETATION-CFDNA: NEGATIVE

## 2016-02-22 LAB — OB RESULTS CONSOLE VARICELLA ZOSTER ANTIBODY, IGG: Varicella: IMMUNE

## 2016-02-22 LAB — SICKLE CELL SCREEN

## 2016-02-22 LAB — OB RESULTS CONSOLE RUBELLA ANTIBODY, IGM: Rubella: IMMUNE

## 2016-03-31 ENCOUNTER — Encounter (HOSPITAL_COMMUNITY): Payer: Self-pay | Admitting: *Deleted

## 2016-03-31 ENCOUNTER — Inpatient Hospital Stay (HOSPITAL_COMMUNITY)
Admission: AD | Admit: 2016-03-31 | Discharge: 2016-03-31 | Disposition: A | Discharge status: Home / Self Care | Payer: Medicaid Other | Source: Ambulatory Visit | Attending: Obstetrics and Gynecology | Admitting: Obstetrics and Gynecology

## 2016-03-31 ENCOUNTER — Inpatient Hospital Stay (HOSPITAL_COMMUNITY): Payer: Medicaid Other

## 2016-03-31 DIAGNOSIS — O26892 Other specified pregnancy related conditions, second trimester: Secondary | ICD-10-CM

## 2016-03-31 DIAGNOSIS — Z3A22 22 weeks gestation of pregnancy: Secondary | ICD-10-CM | POA: Diagnosis not present

## 2016-03-31 DIAGNOSIS — Z3689 Encounter for other specified antenatal screening: Secondary | ICD-10-CM | POA: Diagnosis present

## 2016-03-31 DIAGNOSIS — R109 Unspecified abdominal pain: Secondary | ICD-10-CM | POA: Diagnosis not present

## 2016-03-31 DIAGNOSIS — O0930 Supervision of pregnancy with insufficient antenatal care, unspecified trimester: Secondary | ICD-10-CM | POA: Insufficient documentation

## 2016-03-31 LAB — URINALYSIS, ROUTINE W REFLEX MICROSCOPIC
BILIRUBIN URINE: NEGATIVE
Glucose, UA: NEGATIVE mg/dL
HGB URINE DIPSTICK: NEGATIVE
KETONES UR: NEGATIVE mg/dL
Leukocytes, UA: NEGATIVE
NITRITE: NEGATIVE
PROTEIN: NEGATIVE mg/dL
SPECIFIC GRAVITY, URINE: 1.009 (ref 1.005–1.030)
pH: 7 (ref 5.0–8.0)

## 2016-03-31 LAB — RAPID URINE DRUG SCREEN, HOSP PERFORMED
Amphetamines: NOT DETECTED
Barbiturates: NOT DETECTED
Benzodiazepines: NOT DETECTED
COCAINE: NOT DETECTED
OPIATES: NOT DETECTED
TETRAHYDROCANNABINOL: NOT DETECTED

## 2016-03-31 NOTE — Discharge Instructions (Signed)
Abdominal Pain During Pregnancy °Belly (abdominal) pain is common during pregnancy. Most of the time, it is not a serious problem. Other times, it can be a sign that something is wrong with the pregnancy. Always tell your doctor if you have belly pain. °Follow these instructions at home: °Monitor your belly pain for any changes. The following actions may help you feel better: °· Do not have sex (intercourse) or put anything in your vagina until you feel better. °· Rest until your pain stops. °· Drink clear fluids if you feel sick to your stomach (nauseous). Do not eat solid food until you feel better. °· Only take medicine as told by your doctor. °· Keep all doctor visits as told. °Get help right away if: °· You are bleeding, leaking fluid, or pieces of tissue come out of your vagina. °· You have more pain or cramping. °· You keep throwing up (vomiting). °· You have pain when you pee (urinate) or have blood in your pee. °· You have a fever. °· You do not feel your baby moving as much. °· You feel very weak or feel like passing out. °· You have trouble breathing, with or without belly pain. °· You have a very bad headache and belly pain. °· You have fluid leaking from your vagina and belly pain. °· You keep having watery poop (diarrhea). °· Your belly pain does not go away after resting, or the pain gets worse. °This information is not intended to replace advice given to you by your health care provider. Make sure you discuss any questions you have with your health care provider. °Document Released: 03/20/2009 Document Revised: 11/08/2015 Document Reviewed: 10/29/2012 °Elsevier Interactive Patient Education © 2017 Elsevier Inc. ° °

## 2016-03-31 NOTE — MAU Note (Signed)
Her mucous plug came out, some last night and a lot this morning.  Thinks she is dilating, because she is having sharp pain in abd and low back.  Hx of PT delivery.  No care in [place], last saw MD in [place] end of November

## 2016-03-31 NOTE — MAU Provider Note (Signed)
History   [id number] @ apx 22wks in with abd pain x 2 days states feels like contractions. Denies ROM or vag bleeding.   CSN: [id number]  Arrival date & time 03/31/16  1648   None     Chief Complaint  Patient presents with  . Abdominal Cramping    HPI  Past Medical History:  Diagnosis Date  . Anemia   . Herpes genitalia   . HIV exposure 12/2015    Past Surgical History:  Procedure Laterality Date  . NO PAST SURGERIES      No family history on file.  Social History  Substance Use Topics  . Smoking status: Current Every Day Smoker    Years: 10.00    Types: Cigars  . Smokeless tobacco: Never Used  . Alcohol use Yes    OB History    Gravida Para Term Preterm AB Living   7 3 2 1 2 2    SAB TAB Ectopic Multiple Live Births   1 1     3       Review of Systems  Constitutional: Negative.   HENT: Negative.   Eyes: Negative.   Respiratory: Negative.   Cardiovascular: Negative.   Gastrointestinal: Positive for abdominal pain.  Endocrine: Negative.   Genitourinary: Negative.   Musculoskeletal: Negative.   Skin: Negative.   Allergic/Immunologic: Negative.   Neurological: Negative.   Hematological: Negative.   Psychiatric/Behavioral: Negative.     Allergies  Patient has no known allergies.  Home Medications    BP 117/65 (BP Location: Right Arm)   Pulse 103   Temp 98.6 F (37 C) (Oral)   Resp 18   Wt 218 lb 3.2 oz (99 kg)   LMP 09/29/2015 (Approximate)   Physical Exam  Constitutional: She appears well-developed and well-nourished.  HENT:  Head: Normocephalic.  Eyes: Conjunctivae and EOM are normal. Pupils are equal, round, and reactive to light.  Neck: Normal range of motion. Neck supple.  Cardiovascular: Normal rate, regular rhythm, normal heart sounds and intact distal pulses.   Pulmonary/Chest: Effort normal and breath sounds normal.  Abdominal: Soft. Bowel sounds are normal.  Genitourinary: Vagina normal and uterus normal.  Musculoskeletal:  Normal range of motion.  Neurological: She is alert. She has normal reflexes.  Skin: Skin is warm and dry.  Psychiatric: She has a normal mood and affect. Her behavior is normal. Judgment and thought content normal.    MAU Course  Procedures (including critical care time)  Labs Reviewed  URINALYSIS, ROUTINE W REFLEX MICROSCOPIC - Abnormal; Notable for the following:       Result Value   Color, Urine STRAW (*)    All other components within normal limits  RAPID URINE DRUG SCREEN, HOSP PERFORMED   No results found.   No diagnosis found.    MDM  U/s shows IUP at 22 wks. sve firm/cl/post/high. Discussed importance of PNC. Will d/c home

## 2016-03-31 NOTE — MAU Note (Signed)
Urine sent to lab @1658 

## 2016-04-15 NOTE — L&D Delivery Note (Signed)
      Delivery Note After a 3 contraction 2nd stage, At 1:51 AM a viable female was delivered via Vaginal, Spontaneous Delivery (Presentation: LOA;  ).  APGAR: , 9/9; weight  .  Pending  After 1 minute, the cord was clamped and cut. 40 units of pitocin diluted in 1000cc LR was infused rapidly IV.  The placenta separated spontaneously and delivered via CCT and maternal pushing effort.  It was inspected and appears to be intact with a 3 VC.   Anesthesia:  epidural Episiotomy: None Lacerations:  none Suture Repair:  Est. Blood Loss (mL):  50  Mom to postpartum.  Baby to Couplet care / Skin to Skin. THe above by Cleone Slim, SNM under my direct supervision CRESENZO-DISHMAN,Cheryl Baxter 08/09/2016, 1:56 AM

## 2016-05-06 LAB — OB RESULTS CONSOLE ABO/RH: RH Type: NEGATIVE

## 2016-05-06 LAB — GLUCOSE TOLERANCE, 1 HOUR (50G) W/O FASTING: Glucose, GTT - 1 Hour: 71 mg/dL (ref ?–200)

## 2016-05-06 LAB — OB RESULTS CONSOLE ANTIBODY SCREEN: ANTIBODY SCREEN: NEGATIVE

## 2016-07-20 ENCOUNTER — Encounter (HOSPITAL_COMMUNITY): Payer: Self-pay

## 2016-07-20 ENCOUNTER — Inpatient Hospital Stay (HOSPITAL_COMMUNITY)
Admission: AD | Admit: 2016-07-20 | Discharge: 2016-07-20 | Disposition: A | Discharge status: Home / Self Care | Payer: Medicaid Other | Source: Ambulatory Visit | Attending: Obstetrics and Gynecology | Admitting: Obstetrics and Gynecology

## 2016-07-20 DIAGNOSIS — O9989 Other specified diseases and conditions complicating pregnancy, childbirth and the puerperium: Secondary | ICD-10-CM

## 2016-07-20 DIAGNOSIS — O26893 Other specified pregnancy related conditions, third trimester: Secondary | ICD-10-CM | POA: Insufficient documentation

## 2016-07-20 DIAGNOSIS — O99333 Smoking (tobacco) complicating pregnancy, third trimester: Secondary | ICD-10-CM | POA: Insufficient documentation

## 2016-07-20 DIAGNOSIS — Z3A38 38 weeks gestation of pregnancy: Secondary | ICD-10-CM | POA: Diagnosis not present

## 2016-07-20 DIAGNOSIS — N898 Other specified noninflammatory disorders of vagina: Secondary | ICD-10-CM | POA: Diagnosis not present

## 2016-07-20 DIAGNOSIS — O009 Unspecified ectopic pregnancy without intrauterine pregnancy: Secondary | ICD-10-CM

## 2016-07-20 LAB — URINALYSIS, ROUTINE W REFLEX MICROSCOPIC
BILIRUBIN URINE: NEGATIVE
GLUCOSE, UA: NEGATIVE mg/dL
KETONES UR: NEGATIVE mg/dL
LEUKOCYTES UA: NEGATIVE
Nitrite: NEGATIVE
PROTEIN: NEGATIVE mg/dL
RBC / HPF: NONE SEEN RBC/hpf (ref 0–5)
Specific Gravity, Urine: 1.005 (ref 1.005–1.030)
pH: 7 (ref 5.0–8.0)

## 2016-07-20 LAB — CBC
HCT: 38.1 % (ref 36.0–46.0)
Hemoglobin: 13 g/dL (ref 12.0–15.0)
MCH: 26 pg (ref 26.0–34.0)
MCHC: 34.1 g/dL (ref 30.0–36.0)
MCV: 76.2 fL — ABNORMAL LOW (ref 78.0–100.0)
PLATELETS: 276 10*3/uL (ref 150–400)
RBC: 5 MIL/uL (ref 3.87–5.11)
RDW: 14.4 % (ref 11.5–15.5)
WBC: 7 10*3/uL (ref 4.0–10.5)

## 2016-07-20 LAB — WET PREP, GENITAL
Clue Cells Wet Prep HPF POC: NONE SEEN
SPERM: NONE SEEN
Trich, Wet Prep: NONE SEEN
Yeast Wet Prep HPF POC: NONE SEEN

## 2016-07-20 LAB — DIFFERENTIAL
BASOS PCT: 0 %
Basophils Absolute: 0 10*3/uL (ref 0.0–0.1)
EOS ABS: 0 10*3/uL (ref 0.0–0.7)
EOS PCT: 1 %
LYMPHS PCT: 16 %
Lymphs Abs: 1.1 10*3/uL (ref 0.7–4.0)
MONO ABS: 0.6 10*3/uL (ref 0.1–1.0)
Monocytes Relative: 9 %
Neutro Abs: 5.2 10*3/uL (ref 1.7–7.7)
Neutrophils Relative %: 74 %

## 2016-07-20 LAB — ABO/RH: ABO/RH(D): B NEG

## 2016-07-20 LAB — POCT FERN TEST

## 2016-07-20 MED ORDER — NALBUPHINE HCL 10 MG/ML IJ SOLN: 10.0000 mg | Freq: Once | INTRAMUSCULAR | Status: DC

## 2016-07-20 MED ORDER — PROMETHAZINE HCL 25 MG/ML IJ SOLN: 12.5000 mg | Freq: Once | INTRAMUSCULAR | Status: DC

## 2016-07-20 MED ORDER — VALACYCLOVIR HCL 1 G PO TABS: 2000.0000 mg | ORAL_TABLET | Freq: Two times a day (BID) | ORAL | 1 refills | Status: AC

## 2016-07-20 NOTE — MAU Note (Signed)
Urine sent to lab 

## 2016-07-20 NOTE — Progress Notes (Addendum)
G7P2 @ 38+[redacted] wksga by ultra sound 2 months ago. Presents to triage for r/o LOF, back and vaginal pain and Yeast infection/UTI.  Currently in and out of state from [place] so OB care has been in [place]. States now living with her mother for few months. Denies bleeding. +FM   Hx: Vaginal herpes 2011. Recently tx for the out break 3 weeks ago but pt states current outbreak  EFM applied. See flow sheet for VS.    1500: Fern test negative  1534: provider at bs assessing pt. POC discussed. GBS, CG, wet prep, done.   1545: pulse ox placed. Turned pt   1547: tranducer adjusted.  1730: discharge instructions given with pt understanding. Pt left unit via ambulatory.

## 2016-07-20 NOTE — Discharge Instructions (Signed)

## 2016-07-20 NOTE — MAU Note (Cosign Needed)
MAU CPatient Cheryl Baxter is a 26 year old [id number] at 38 weeks and 2 days here with complaints of vaginal discharge which she thinks is amniotic fluid, abdominal pain when she turns over, and she thinks she has a herpes outbreak on her suprapubic area because she has some discomfort when her waistband touches her skin. She denies tingling, burning on her skin in this area. She also has an odor when she urinates, but she denies dysuria or flank pain. She denies bleeding, leaking of fluid or decreased fetal movements.   Patient has been getting prenatal care in [place] but due to traveling back and forth between [place] and [place] she has missed her prenatal appointments since 30 weeks. She has tried to get into a provider in the area but all of the practices she called were not taking new patients or were not taking medicaid.   She denies any complications in this pregnancy or in her past pregnancies; no hx of c-section.   She has not been taking her valtrex.   History   CSN: [id number]  Arrival date and time: 07/20/16 1411   None        Chief Complaint  Patient presents with  . Leaking of fluid  . Abdominal Pain  . Vaginal Pain  . Vaginitis  . Urinary Tract Infection   Vaginal Discharge  The patient's primary symptoms include vaginal discharge. The patient's pertinent negatives include no genital itching, genital lesions, genital odor, genital rash, missed menses or pelvic pain. This is a new problem. The current episode started in the past 7 days. The problem occurs intermittently. The problem has been unchanged. The pain is mild. Associated symptoms include abdominal pain. The vaginal discharge was normal. She has not been passing clots. She has not been passing tissue. Nothing aggravates the symptoms. She has tried nothing for the symptoms.            OB History    Gravida Para Term Preterm AB Living   SAB TAB Ectopic Multiple Live Births   Past Medical History:  Diagnosis Date  . Anemia   . Herpes genitalia   . HIV exposure 12/2015         Past Surgical History:  Procedure Laterality Date  . NO PAST SURGERIES      No family history on file.        Social History  Substance Use Topics  . Smoking status: Current Every Day Smoker    Years: 10.00    Types: Cigars  . Smokeless tobacco: Never Used  . Alcohol use Yes     Allergies: No Known Allergies         Prescriptions Prior to Admission  Medication Sig Dispense Refill Last Dose  . ondansetron (ZOFRAN ODT) 4 MG disintegrating tablet Take 1 tablet (4 mg total) by mouth every 6 (six) hours as needed for nausea. 20 tablet 2 Past Week at Unknown time  . Prenatal Vit-Fe Fumarate-FA (PREPLUS) 27-1 MG TABS Take 1 tablet by mouth daily. 30 tablet 13 07/19/2016 at Unknown time    Review of Systems  Respiratory: Negative.   Cardiovascular: Negative.   Gastrointestinal: Positive for abdominal pain.  Genitourinary: Positive for vaginal discharge. Negative for missed menses and pelvic pain.   Physical Exam   Blood pressure 120/65, pulse (!) 106, temperature 98.1  F (36.7 C), temperature source Oral, resp. rate 18, last menstrual period 09/29/2015, SpO2 98 %.  Physical Exam  Constitutional: She is oriented to person, place, and time. She appears well-developed and well-nourished.  HENT:  Head: Normocephalic.  Neck: Normal range of motion.  Cardiovascular: Normal rate.   Respiratory: Effort normal.  GI: Soft. She exhibits no distension and no mass. There is no tenderness. There is no rebound and no guarding.  Genitourinary:  Genitourinary Comments: NEFG; no lesions on her vaginal walls, no CMT, no suprapubic tenderness and no adnexal tenderness. No signs of herpetic lesions on her suprapubic region.   Musculoskeletal: Normal range of motion.  Neurological: She is alert and oriented to person, place, and time. She has normal  reflexes.  Skin: Skin is warm and dry.    MAU Course  Procedures  MDM -Prenatal panel -GC CT pending pending -OB culture pending -GBS culture pending -wet prep: negative  -UA: negative -ferning negative  NST: 135 bpm with moderate variability, present acels, occasional contractions. Patient had occasioanl deceleartions during vaginal exams and when being repositioned.   Assessment and Plan   1. Vaginal discharge    2. Patient stable for discharge; prenatal labs pending. 3. RX for Valtrex 1000 mg BID sent 4. Reviewed when to return to the   [employer] 07/20/2016, 4:22 PM  ourse  Procedures  MDM -Prenatal panel -GC CT pending pending -OB culture pending -GBS culture pending -wet prep: negative  -UA: negative -ferning negative  NST: 135 bpm with moderate variability, present acels, occasional contractions. Patient had occasioanl deceleartions during vaginal exams and when being repositioned.   Assessment and Plan   1. Vaginal discharge    2. Patient stable for discharge; prenatal labs pending. 3. RX for Valtrex 1000 mg BID sent 4. Reviewed when to return to the MAU (bleeding, leaking of fluid, decreased fetal movements) 5. Msg sent to [facility] to call her to set up an initial prenatal visit.   Charlesetta Garibaldi Krishika Bugge 07/20/2016, 4:22 PM

## 2016-07-21 LAB — CULTURE, OB URINE

## 2016-07-21 LAB — HEPATITIS B SURFACE ANTIGEN: HEP B S AG: NEGATIVE

## 2016-07-22 ENCOUNTER — Telehealth: Payer: Self-pay | Admitting: Radiology

## 2016-07-22 LAB — GC/CHLAMYDIA PROBE AMP (~~LOC~~) NOT AT ARMC
Chlamydia: NEGATIVE
Neisseria Gonorrhea: NEGATIVE

## 2016-07-22 LAB — CULTURE, BETA STREP (GROUP B ONLY)

## 2016-07-22 NOTE — Telephone Encounter (Signed)
Called and left message on the cell phone voicemail to return call to [facility] to schedule appointment

## 2016-07-22 NOTE — Telephone Encounter (Signed)
-----   Message from Marylene Land, CNM sent at 07/20/2016  5:23 PM EDT ----- Hi!! Do you mind calling this patient to schedule her for an initial prenatal visit this week? She is 38 weeks and is willing to drive to [facility]. Thank you!

## 2016-07-24 LAB — HIV ANTIBODY (ROUTINE TESTING W REFLEX): HIV Screen 4th Generation wRfx: NONREACTIVE

## 2016-07-24 LAB — SYPHILIS: RPR W/REFLEX TO RPR TITER AND TREPONEMAL ANTIBODIES, TRADITIONAL SCREENING AND DIAGNOSIS ALGORITHM: RPR Ser Ql: NONREACTIVE

## 2016-07-24 LAB — RUBELLA SCREEN: Rubella: 1.65 {index}

## 2016-07-31 ENCOUNTER — Encounter: Payer: Self-pay | Admitting: *Deleted

## 2016-07-31 ENCOUNTER — Ambulatory Visit: Payer: Medicaid Other | Admitting: Obstetrics and Gynecology

## 2016-08-01 NOTE — Progress Notes (Signed)
Pt did not show up for her OB appt.

## 2016-08-06 ENCOUNTER — Telehealth: Payer: Self-pay | Admitting: Family Medicine

## 2016-08-06 NOTE — Telephone Encounter (Signed)
Patient called to say she has not seen a doctor since January. She was late, and missed her last appointment on 07/31/2016 in this office. I informed her to make sure she comes to her appointment on 08/08/2016, and to try to be on time. She states she is pass due to have her baby. I informed her that if she wasn't feeling well to go to the Maternity Admissions. She stated she understood.

## 2016-08-07 DIAGNOSIS — Z3490 Encounter for supervision of normal pregnancy, unspecified, unspecified trimester: Principal | ICD-10-CM

## 2016-08-07 DIAGNOSIS — Z331 Pregnant state, incidental: Secondary | ICD-10-CM | POA: Insufficient documentation

## 2016-08-08 ENCOUNTER — Encounter (HOSPITAL_COMMUNITY): Payer: Self-pay | Admitting: Family Medicine

## 2016-08-08 ENCOUNTER — Inpatient Hospital Stay (HOSPITAL_COMMUNITY)
Admission: AD | Admit: 2016-08-08 | Discharge: 2016-08-10 | DRG: 775 | Disposition: A | Discharge status: Home / Self Care | Payer: Medicaid Other | Source: Ambulatory Visit | Attending: Obstetrics & Gynecology | Admitting: Obstetrics & Gynecology

## 2016-08-08 ENCOUNTER — Ambulatory Visit: Payer: Medicaid Other | Admitting: Obstetrics and Gynecology

## 2016-08-08 ENCOUNTER — Inpatient Hospital Stay (HOSPITAL_COMMUNITY): Payer: Medicaid Other | Admitting: Anesthesiology

## 2016-08-08 DIAGNOSIS — O99334 Smoking (tobacco) complicating childbirth: Secondary | ICD-10-CM | POA: Diagnosis present

## 2016-08-08 DIAGNOSIS — F17290 Nicotine dependence, other tobacco product, uncomplicated: Secondary | ICD-10-CM | POA: Diagnosis present

## 2016-08-08 DIAGNOSIS — Z3A41 41 weeks gestation of pregnancy: Secondary | ICD-10-CM | POA: Diagnosis not present

## 2016-08-08 DIAGNOSIS — O26893 Other specified pregnancy related conditions, third trimester: Secondary | ICD-10-CM | POA: Diagnosis present

## 2016-08-08 DIAGNOSIS — Z6791 Unspecified blood type, Rh negative: Secondary | ICD-10-CM

## 2016-08-08 DIAGNOSIS — O480 Post-term pregnancy: Principal | ICD-10-CM | POA: Diagnosis present

## 2016-08-08 DIAGNOSIS — Z8759 Personal history of other complications of pregnancy, childbirth and the puerperium: Secondary | ICD-10-CM

## 2016-08-08 LAB — CBC
HCT: 38.1 % (ref 36.0–46.0)
Hemoglobin: 13 g/dL (ref 12.0–15.0)
MCH: 26.2 pg (ref 26.0–34.0)
MCHC: 34.1 g/dL (ref 30.0–36.0)
MCV: 76.7 fL — AB (ref 78.0–100.0)
PLATELETS: 286 10*3/uL (ref 150–400)
RBC: 4.97 MIL/uL (ref 3.87–5.11)
RDW: 14.6 % (ref 11.5–15.5)
WBC: 7.9 10*3/uL (ref 4.0–10.5)

## 2016-08-08 LAB — RAPID URINE DRUG SCREEN, HOSP PERFORMED
Amphetamines: NOT DETECTED
BARBITURATES: NOT DETECTED
Benzodiazepines: NOT DETECTED
Cocaine: NOT DETECTED
Opiates: NOT DETECTED
Tetrahydrocannabinol: NOT DETECTED

## 2016-08-08 LAB — TYPE AND SCREEN
ABO/RH(D): B NEG
Antibody Screen: NEGATIVE

## 2016-08-08 MED ORDER — TERBUTALINE SULFATE 1 MG/ML IJ SOLN
0.2500 mg | Freq: Once | INTRAMUSCULAR | Status: DC | PRN
  Filled 2016-08-08: qty 1

## 2016-08-08 MED ORDER — PHENYLEPHRINE 40 MCG/ML (10ML) SYRINGE FOR IV PUSH (FOR BLOOD PRESSURE SUPPORT)
PREFILLED_SYRINGE | INTRAVENOUS | Status: AC
  Filled 2016-08-08: qty 20

## 2016-08-08 MED ORDER — SOD CITRATE-CITRIC ACID 500-334 MG/5ML PO SOLN: 30.0000 mL | ORAL | Status: DC | PRN

## 2016-08-08 MED ORDER — OXYCODONE-ACETAMINOPHEN 5-325 MG PO TABS: 2.0000 | ORAL_TABLET | ORAL | Status: DC | PRN

## 2016-08-08 MED ORDER — ONDANSETRON HCL 4 MG/2ML IJ SOLN: 4.0000 mg | Freq: Four times a day (QID) | INTRAMUSCULAR | Status: DC | PRN

## 2016-08-08 MED ORDER — OXYTOCIN 40 UNITS IN LACTATED RINGERS INFUSION - SIMPLE MED
2.5000 [IU]/h | INTRAVENOUS | Status: DC
  Administered 2016-08-09: 2.5 [IU]/h via INTRAVENOUS

## 2016-08-08 MED ORDER — MISOPROSTOL 25 MCG QUARTER TABLET
25.0000 ug | ORAL_TABLET | ORAL | Status: DC | PRN
  Administered 2016-08-08: 25 ug via VAGINAL
  Filled 2016-08-08: qty 1

## 2016-08-08 MED ORDER — OXYTOCIN 40 UNITS IN LACTATED RINGERS INFUSION - SIMPLE MED
1.0000 m[IU]/min | INTRAVENOUS | Status: DC
  Administered 2016-08-08: 2 m[IU]/min via INTRAVENOUS
  Filled 2016-08-08: qty 1000

## 2016-08-08 MED ORDER — ACETAMINOPHEN 325 MG PO TABS: 650.0000 mg | ORAL_TABLET | ORAL | Status: DC | PRN

## 2016-08-08 MED ORDER — OXYTOCIN BOLUS FROM INFUSION
500.0000 mL | Freq: Once | INTRAVENOUS | Status: DC
  Administered 2016-08-09: 500 mL via INTRAVENOUS

## 2016-08-08 MED ORDER — FENTANYL 2.5 MCG/ML BUPIVACAINE 1/10 % EPIDURAL INFUSION (WH - ANES)
INTRAMUSCULAR | Status: AC
  Filled 2016-08-08: qty 100

## 2016-08-08 MED ORDER — LACTATED RINGERS IV SOLN
INTRAVENOUS | Status: DC
  Administered 2016-08-08: 17:00:00 via INTRAVENOUS

## 2016-08-08 MED ORDER — LIDOCAINE HCL (PF) 1 % IJ SOLN
30.0000 mL | INTRAMUSCULAR | Status: DC | PRN
  Filled 2016-08-08: qty 30

## 2016-08-08 MED ORDER — OXYCODONE-ACETAMINOPHEN 5-325 MG PO TABS: 1.0000 | ORAL_TABLET | ORAL | Status: DC | PRN

## 2016-08-08 MED ORDER — FENTANYL CITRATE (PF) 100 MCG/2ML IJ SOLN: 100.0000 ug | INTRAMUSCULAR | Status: DC | PRN

## 2016-08-08 MED ORDER — LACTATED RINGERS IV SOLN: 500.0000 mL | INTRAVENOUS | Status: DC | PRN

## 2016-08-08 NOTE — Anesthesia Preprocedure Evaluation (Signed)

## 2016-08-08 NOTE — Progress Notes (Signed)
   Cheryl Baxter is a 26 y.o. [id number] at [redacted]w[redacted]d  admitted for induction of labor due to Post dates..  Subjective:  Hungry. No othre complaints  Objective: Vitals:   08/08/16 1609 08/08/16 1646 08/08/16 1907  BP: 123/73  124/79  Pulse: (!) 110  (!) 103  Resp: 16  18  Temp: 98.4 F (36.9 C)  98.9 F (37.2 C)  TempSrc: Oral  Oral  Weight:  93.4 kg (206 lb)   Height:   (1.549 m)    Total I/O In: 125 [I.V.:125] Out: -   FHT:  FHR: 145 bpm, variability: moderate,  accelerations:  Present,  decelerations:  Absent UC:   rare SVE:   Dilation: 4.5 Effacement (%): 70 Station: -2 Exam by:: Straight RN Folety fell out.   Labs: Lab Results  Component Value Date   WBC 7.9 08/08/2016   HGB 13.0 08/08/2016   HCT 38.1 08/08/2016   MCV 76.7 (L) 08/08/2016   PLT 286 08/08/2016    Assessment / Plan: IOL for postdates, ripening phase complete Will allow to eat a light meal then start pitocin Labor: Progressing normally Fetal Wellbeing:  Category I Pain Control:  Labor support without medications Anticipated MOD:  NSVD  CRESENZO-DISHMAN,[name] 08/08/2016, 10:14 PM

## 2016-08-08 NOTE — Progress Notes (Signed)
Patient was not seen for new ob appointment. Given GA [redacted] weeks, patient was sent to birthing suites for induction of labor. Call team has been informed

## 2016-08-08 NOTE — Anesthesia Pain Management Evaluation Note (Signed)
  CRNA Pain Management Visit Note  Patient: Cheryl Baxter, 26 y.o., female  "Hello I am a member of the anesthesia team at [facility]. We have an anesthesia team available at all times to provide care throughout the hospital, including epidural management and anesthesia for C-section. I don't know your plan for the delivery whether it a natural birth, water birth, IV sedation, nitrous supplementation, doula or epidural, but we want to meet your pain goals."   1.Was your pain managed to your expectations on prior hospitalizations?   Yes   2.What is your expectation for pain management during this hospitalization?     Epidural and IV pain meds  3.How can we help you reach that goal? IV pain meds, epidural if desired.  Record the patient's initial score and the patient's pain goal.   Pain: 0  Pain Goal: 6 [facility] wants you to be able to say your pain was always managed very well.  Cheryl Baxter L 08/08/2016

## 2016-08-08 NOTE — H&P (Signed)
LABOR AND DELIVERY ADMISSION HISTORY AND PHYSICAL NOTE  Cheryl Baxter is a 26 y.o. female [phone] with IUP at 17 wk [place] at [redacted]w[redacted]d presenting for post dates induction of labor.   Patient received all her prenatal care in [place], she moved to [place], [place] when she was about [redacted] weeks pregnant, and states she was unable to get an appointment to be seen at any OB office. She showed up today for her initial prenatal appt at [facility], but due to being 41 weeks, was sent for IOL.   She reports positive fetal movement. She denies leakage of fluid or vaginal bleeding.  Has history of HSV, states she has been on Valtrex since about 36 weeks. Her last outbreak was at [redacted] weeks pregnant. She denies any symptoms currently. Patient was homeless  Prenatal History/Complications: Patient had a 24 week preterm delivery last year 22-Jun-2015), baby is deceased,day after delivery. History of HSV.   Past Medical History: Past Medical History:  Diagnosis Date  . Anemia   . Herpes genitalia   . HIV exposure 12/2015    Past Surgical History: Past Surgical History:  Procedure Laterality Date  . NO PAST SURGERIES      Obstetrical History: OB History    Gravida Para Term Preterm AB Living   SAB TAB Ectopic Multiple Live Births   Social History: Social History   Social History  . Marital status: Single    Spouse name: N/A  . Number of children: N/A  . Years of education: N/A   Social History Main Topics  . Smoking status: Current Every Day Smoker    Years: 10.00    Types: Cigars  . Smokeless tobacco: Never Used  . Alcohol use Yes  . Drug use: No  . Sexual activity: Yes    Birth control/ protection: None   Other Topics Concern  . Not on file   Social History Narrative  . No narrative on file    Family History: No family history on file.  Allergies: No Known Allergies  Prescriptions Prior to Admission  Medication Sig Dispense Refill Last Dose  . Prenatal  Vit-Fe Fumarate-FA (PREPLUS) 27-1 MG TABS Take 1 tablet by mouth daily. 30 tablet 13 07/19/2016 at Unknown time     Review of Systems   All systems reviewed and negative except as stated in HPI  Physical Exam Blood pressure 123/73, pulse (!) 110, temperature 98.4 F (36.9 C), temperature source Oral, resp. rate 16, last menstrual period 09/29/2015. General appearance: alert, cooperative, appears stated age and no distress Lungs: clear to auscultation bilaterally Heart: regular rate and rhythm Abdomen: soft, non-tender; bowel sounds normal Extremities: No calf swelling or tenderness Pelvic: Speculum exam performed, NO LESIONS seen on cervix, vaginal mucosa or external genitalia.  Presentation: cephalic Fetal monitoring: 130 bpm, mod var, +accels, no decels Uterine activity: Irregular, rare    Prenatal labs: ABO, Rh: --/--/B NEG (04/07 1604) Antibody: Negative (01/22 0000) Rubella: !Error! IMMUNE RPR: Non Reactive (04/07 1611)  HBsAg: Negative (04/07 1611)  HIV: Non Reactive (04/07 1611)  GBS:   NEG 1 hr Glucola: 71 Genetic screening:  Quad negative; CF Neg Anatomy US: Normal, but suboptimal views  Prenatal Transfer Tool  Maternal Diabetes: No Genetic Screening: Normal Maternal Ultrasounds/Referrals: Normal  Fetal Ultrasounds or other Referrals:  None Maternal Substance Abuse:  No Significant Maternal Medications:  Meds include: Other:  Valtrex  Significant Maternal Lab Results: Lab values include: Group B Strep negative, Rh negative  No results found for this or any previous visit (from the past 24 hour(s)).  Patient Active Problem List   Diagnosis Date Noted  . IUP (intrauterine pregnancy), incidental 08/07/2016    Assessment: Cheryl Baxter is a 26 y.o. [id number] at [redacted]w[redacted]d here for PDIOL.  #Labor: PDIOL, Foley bulb placed, cytotec to start #Pain: Epidural on request; IV pain meds prn #FWB: Cat I #ID:  GBS Neg #MOF: Bottle #MOC: OCPs #Circ:  If boy,  outpatient #Rh Neg: Rhogam PP if indicated  Jen Mow, DO OB Fellow [facility], [facility] 08/08/2016, 4:30 PM

## 2016-08-09 ENCOUNTER — Encounter (HOSPITAL_COMMUNITY): Payer: Self-pay

## 2016-08-09 DIAGNOSIS — O480 Post-term pregnancy: Secondary | ICD-10-CM

## 2016-08-09 DIAGNOSIS — Z3A41 41 weeks gestation of pregnancy: Secondary | ICD-10-CM

## 2016-08-09 LAB — RPR: RPR Ser Ql: NONREACTIVE

## 2016-08-09 MED ORDER — IBUPROFEN 600 MG PO TABS
600.0000 mg | ORAL_TABLET | Freq: Four times a day (QID) | ORAL | Status: DC
  Administered 2016-08-09 – 2016-08-10 (×6): 600 mg via ORAL
  Filled 2016-08-09 (×6): qty 1

## 2016-08-09 MED ORDER — EPHEDRINE 5 MG/ML INJ
10.0000 mg | INTRAVENOUS | Status: DC | PRN
  Filled 2016-08-09: qty 2

## 2016-08-09 MED ORDER — DIPHENHYDRAMINE HCL 25 MG PO CAPS: 25.0000 mg | ORAL_CAPSULE | Freq: Four times a day (QID) | ORAL | Status: DC | PRN

## 2016-08-09 MED ORDER — ZOLPIDEM TARTRATE 5 MG PO TABS: 5.0000 mg | ORAL_TABLET | Freq: Every evening | ORAL | Status: DC | PRN

## 2016-08-09 MED ORDER — SIMETHICONE 80 MG PO CHEW: 80.0000 mg | CHEWABLE_TABLET | ORAL | Status: DC | PRN

## 2016-08-09 MED ORDER — PHENYLEPHRINE 40 MCG/ML (10ML) SYRINGE FOR IV PUSH (FOR BLOOD PRESSURE SUPPORT)
80.0000 ug | PREFILLED_SYRINGE | INTRAVENOUS | Status: DC | PRN
  Filled 2016-08-09: qty 5

## 2016-08-09 MED ORDER — METHYLERGONOVINE MALEATE 0.2 MG PO TABS: 0.2000 mg | ORAL_TABLET | ORAL | Status: DC | PRN

## 2016-08-09 MED ORDER — FLEET ENEMA 7-19 GM/118ML RE ENEM: 1.0000 | ENEMA | Freq: Every day | RECTAL | Status: DC | PRN

## 2016-08-09 MED ORDER — ONDANSETRON HCL 4 MG/2ML IJ SOLN: 4.0000 mg | INTRAMUSCULAR | Status: DC | PRN

## 2016-08-09 MED ORDER — PRENATAL MULTIVITAMIN CH
1.0000 | ORAL_TABLET | Freq: Every day | ORAL | Status: DC
  Administered 2016-08-09 – 2016-08-10 (×2): 1 via ORAL
  Filled 2016-08-09 (×2): qty 1

## 2016-08-09 MED ORDER — BISACODYL 10 MG RE SUPP: 10.0000 mg | Freq: Every day | RECTAL | Status: DC | PRN

## 2016-08-09 MED ORDER — FERROUS SULFATE 325 (65 FE) MG PO TABS
325.0000 mg | ORAL_TABLET | Freq: Two times a day (BID) | ORAL | Status: DC
  Administered 2016-08-09 – 2016-08-10 (×3): 325 mg via ORAL
  Filled 2016-08-09 (×3): qty 1

## 2016-08-09 MED ORDER — DIPHENHYDRAMINE HCL 50 MG/ML IJ SOLN: 12.5000 mg | INTRAMUSCULAR | Status: DC | PRN

## 2016-08-09 MED ORDER — BENZOCAINE-MENTHOL 20-0.5 % EX AERO
1.0000 "application " | INHALATION_SPRAY | CUTANEOUS | Status: DC | PRN
  Administered 2016-08-09: 1 via TOPICAL
  Filled 2016-08-09: qty 56

## 2016-08-09 MED ORDER — FENTANYL 2.5 MCG/ML BUPIVACAINE 1/10 % EPIDURAL INFUSION (WH - ANES)
14.0000 mL/h | INTRAMUSCULAR | Status: DC | PRN
  Administered 2016-08-09: 14 mL/h via EPIDURAL

## 2016-08-09 MED ORDER — OXYCODONE HCL 5 MG PO TABS
5.0000 mg | ORAL_TABLET | ORAL | Status: DC | PRN
  Administered 2016-08-09: 5 mg via ORAL
  Filled 2016-08-09: qty 1

## 2016-08-09 MED ORDER — LACTATED RINGERS IV SOLN: 500.0000 mL | Freq: Once | INTRAVENOUS | Status: DC

## 2016-08-09 MED ORDER — DOCUSATE SODIUM 100 MG PO CAPS
100.0000 mg | ORAL_CAPSULE | Freq: Two times a day (BID) | ORAL | Status: DC
  Administered 2016-08-09 – 2016-08-10 (×2): 100 mg via ORAL
  Filled 2016-08-09 (×2): qty 1

## 2016-08-09 MED ORDER — ONDANSETRON HCL 4 MG PO TABS: 4.0000 mg | ORAL_TABLET | ORAL | Status: DC | PRN

## 2016-08-09 MED ORDER — LIDOCAINE HCL (PF) 1 % IJ SOLN
INTRAMUSCULAR | Status: DC | PRN
  Administered 2016-08-09: 4 mL
  Administered 2016-08-09: 6 mL via EPIDURAL

## 2016-08-09 MED ORDER — OXYCODONE HCL 5 MG PO TABS
10.0000 mg | ORAL_TABLET | ORAL | Status: DC | PRN
  Administered 2016-08-09 – 2016-08-10 (×2): 10 mg via ORAL
  Filled 2016-08-09 (×2): qty 2

## 2016-08-09 MED ORDER — RHO D IMMUNE GLOBULIN 1500 UNIT/2ML IJ SOSY
300.0000 ug | PREFILLED_SYRINGE | Freq: Once | INTRAMUSCULAR | Status: AC
  Administered 2016-08-09: 300 ug via INTRAVENOUS
  Filled 2016-08-09: qty 2

## 2016-08-09 MED ORDER — METHYLERGONOVINE MALEATE 0.2 MG/ML IJ SOLN: 0.2000 mg | INTRAMUSCULAR | Status: DC | PRN

## 2016-08-09 MED ORDER — DIBUCAINE 1 % RE OINT: 1.0000 "application " | TOPICAL_OINTMENT | RECTAL | Status: DC | PRN

## 2016-08-09 MED ORDER — COCONUT OIL OIL: 1.0000 "application " | TOPICAL_OIL | Status: DC | PRN

## 2016-08-09 MED ORDER — WITCH HAZEL-GLYCERIN EX PADS: 1.0000 "application " | MEDICATED_PAD | CUTANEOUS | Status: DC | PRN

## 2016-08-09 MED ORDER — MEASLES, MUMPS & RUBELLA VAC ~~LOC~~ INJ
0.5000 mL | INJECTION | Freq: Once | SUBCUTANEOUS | Status: DC
  Filled 2016-08-09: qty 0.5

## 2016-08-09 MED ORDER — ACETAMINOPHEN 325 MG PO TABS: 650.0000 mg | ORAL_TABLET | ORAL | Status: DC | PRN

## 2016-08-09 MED ORDER — TETANUS-DIPHTH-ACELL PERTUSSIS 5-2.5-18.5 LF-MCG/0.5 IM SUSP: 0.5000 mL | Freq: Once | INTRAMUSCULAR | Status: DC

## 2016-08-09 NOTE — Anesthesia Procedure Notes (Signed)
Epidural Patient location during procedure: OB  Staffing Anesthesiologist: Kahlen Morais  Preanesthetic Checklist Completed: patient identified, site marked, surgical consent, pre-op evaluation, timeout performed, IV checked, risks and benefits discussed and monitors and equipment checked  Epidural Patient position: sitting Prep: site prepped and draped and DuraPrep Patient monitoring: continuous pulse ox and blood pressure Approach: midline Location: L3-L4 Injection technique: LOR air  Needle:  Needle type: Tuohy  Needle gauge: 17 G Needle length: 9 cm and 9 Needle insertion depth: 7 cm Catheter type: closed end flexible Catheter size: 19 Gauge Catheter at skin depth: 14 cm Test dose: negative  Assessment Events: blood not aspirated, injection not painful, no injection resistance, negative IV test and no paresthesia  Additional Notes Dosing of Epidural:  1st dose, through catheter .............................................  Xylocaine 40 mg  2nd dose, through catheter, after waiting 3 minutes.........Xylocaine 60 mg    As each dose occurred, patient was free of IV sx; and patient exhibited no evidence of SA injection.  Patient is more comfortable after epidural dosed. Please see RN's note for documentation of vital signs,and FHR which are stable.  Patient reminded not to try to ambulate with numb legs, and that an RN must be present when she attempts to get up.        

## 2016-08-09 NOTE — Anesthesia Postprocedure Evaluation (Signed)
Anesthesia Post Note  Patient: Cheryl Baxter  Procedure(s) Performed: * No procedures listed *  Patient location during evaluation: Mother Baby Anesthesia Type: Epidural Level of consciousness: awake Pain management: satisfactory to patient Vital Signs Assessment: post-procedure vital signs reviewed and stable Respiratory status: spontaneous breathing Cardiovascular status: stable Anesthetic complications: no        Last Vitals:  Vitals:   08/09/16 0520 08/09/16 0945  BP: 130/67 (!) 104/58  Pulse: 96 90  Resp: 18 17  Temp: 37 C 36.7 C    Last Pain:  Vitals:   08/09/16 0945  TempSrc: Oral  PainSc: 7    Pain Goal: Patients Stated Pain Goal: 3 (08/09/16 0945)               Cephus Shelling

## 2016-08-09 NOTE — Progress Notes (Signed)
UR chart review completed.  

## 2016-08-10 LAB — RH IG WORKUP (INCLUDES ABO/RH)
ABO/RH(D): B NEG
FETAL SCREEN: NEGATIVE
GESTATIONAL AGE(WKS): 41.1
Unit division: 0

## 2016-08-10 MED ORDER — IBUPROFEN 600 MG PO TABS: 600.0000 mg | ORAL_TABLET | Freq: Four times a day (QID) | ORAL | 0 refills | Status: DC | PRN

## 2016-08-10 NOTE — Discharge Instructions (Signed)

## 2016-08-10 NOTE — Progress Notes (Signed)
CSW met with MOB at bedside to assess consult request noting MOB is new to the area and could benefit from resources. Upon this writers arrival, MOB was performing skin to skin with baby . This Probation officer explained role and reasoning for visit. MOB was warm and pleasant upon this writers arrival. This Probation officer offered [employer] and assessed for other psycho-social needs. MOB was thankful for the list and noted she is currently living with her mother but its on the housing wait list. This Probation officer inquired if there were any barriers and/or concerns for securing housing. MOB noted there is not as she can stay with her mother until she is able to secure housing. MOB was thankful for resource list and noted no further needs/concerns.   Shacora Zynda, MSW, LCSW-A Clinical Social Worker  [facility]  Office: [phone]

## 2016-08-10 NOTE — Progress Notes (Signed)
Baby's cord was still very moist after cord revision this morning.  Cord clamp remover sent home with mom.  Mom educated on removal and cord care.

## 2016-08-10 NOTE — Discharge Summary (Signed)
Obstetric Discharge Summary Reason for Admission: induction of labor for postdates Prenatal Procedures: none Intrapartum Procedures: spontaneous vaginal delivery Postpartum Procedures: none Complications-Operative and Postpartum: none Hemoglobin  Date Value Ref Range Status  08/08/2016 13.0 12.0 - 15.0 g/dL Final   HCT  Date Value Ref Range Status  08/08/2016 38.1 36.0 - 46.0 % Final    Physical Exam:  General: alert Lochia: appropriate Uterine Fundus: firm DVT Evaluation: No evidence of DVT seen on physical exam.  Discharge Diagnoses: Post-date pregnancy  Discharge Information: Date: 08/10/2016 Activity: pelvic rest Diet: routine Medications: PNV and Ibuprofen Condition: stable Instructions: refer to practice specific booklet Discharge to: home Follow-up Information    [facility]. Schedule an appointment as soon as possible for a visit in 4 week(s).   Why:  For postpartum appointment Contact information: [address] [place] [address] [phone]          Newborn Data: Live born female  Birth Weight: 7 lb 13.8 oz (3566 g) APGAR: 9, 9  Home with mother.  Cleone Slim SNM 08/10/2016, 7:27 AM    CNM attestation  Cheryl Baxter is a 26 y.o. [id number] s/p SVD.   Pain is well controlled.  Plan for birth control is undecided.  Method of Feeding: bottle  PE:  BP 113/75 (BP Location: Right Arm)   Pulse 83   Temp 98 F (36.7 C) (Oral)   Resp 18   Ht  (1.549 m)   Wt 93.4 kg (206 lb)   LMP 09/29/2015 (Approximate)   SpO2 99%   Breastfeeding? Unknown   BMI 38.92 kg/m  Fundus firm  No results for input(s): HGB, HCT in the last 72 hours.   Plan: discharge today - postpartum care discussed - f/u clinic in 4-6 weeks for postpartum visit   SHAW, KIMBERLY, CNM 9:11 AM

## 2016-09-12 ENCOUNTER — Encounter: Payer: Self-pay | Admitting: Obstetrics and Gynecology

## 2016-09-12 ENCOUNTER — Ambulatory Visit: Payer: Medicaid Other | Admitting: Obstetrics and Gynecology

## 2016-09-25 ENCOUNTER — Ambulatory Visit: Payer: Medicaid Other | Admitting: Obstetrics and Gynecology

## 2016-09-25 ENCOUNTER — Encounter: Payer: Self-pay | Admitting: Obstetrics and Gynecology

## 2021-10-11 ENCOUNTER — Ambulatory Visit (HOSPITAL_COMMUNITY): Payer: Self-pay | Admitting: Clinical

## 2021-10-19 ENCOUNTER — Encounter (HOSPITAL_COMMUNITY): Payer: Self-pay | Admitting: Psychiatry

## 2021-10-19 ENCOUNTER — Encounter (HOSPITAL_COMMUNITY): Payer: Self-pay

## 2021-10-19 ENCOUNTER — Telehealth (HOSPITAL_COMMUNITY): Payer: Self-pay | Admitting: Licensed Clinical Social Worker

## 2021-10-19 ENCOUNTER — Ambulatory Visit (INDEPENDENT_AMBULATORY_CARE_PROVIDER_SITE_OTHER): Payer: Medicaid Other | Admitting: Psychiatry

## 2021-10-19 ENCOUNTER — Ambulatory Visit (HOSPITAL_COMMUNITY): Payer: Medicaid Other | Admitting: Licensed Clinical Social Worker

## 2021-10-19 VITALS — BP 110/78 | Temp 98.3°F | Resp 18

## 2021-10-19 DIAGNOSIS — F3132 Bipolar disorder, current episode depressed, moderate: Secondary | ICD-10-CM

## 2021-10-19 DIAGNOSIS — F4310 Post-traumatic stress disorder, unspecified: Secondary | ICD-10-CM

## 2021-10-19 MED ORDER — ARIPIPRAZOLE 5 MG PO TABS: 5.0000 mg | ORAL_TABLET | Freq: Every day | ORAL | 2 refills | Status: DC

## 2021-10-19 MED ORDER — ESCITALOPRAM OXALATE 10 MG PO TABS: 10.0000 mg | ORAL_TABLET | Freq: Every day | ORAL | 2 refills | Status: DC

## 2021-10-19 MED ORDER — HYDROXYZINE PAMOATE 25 MG PO CAPS: 25.0000 mg | ORAL_CAPSULE | Freq: Every day | ORAL | 2 refills | Status: DC
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
## 2021-10-19 NOTE — Patient Instructions (Signed)
Follow-up in 4 weeks

## 2021-10-19 NOTE — Telephone Encounter (Signed)
LCSW sent two links to pt phone with no response. LCSW f/u with a PC and pt answered stated that she would look at her messages. LCSW disconnected the PC and waited 5 minute for pt to enter video chat and pt did not. LCSW f/u with a PC and it went to VM and VM was full. LCSW had front desk call as well with no answer.

## 2021-11-28 ENCOUNTER — Ambulatory Visit (HOSPITAL_COMMUNITY): Payer: Medicaid Other | Admitting: Licensed Clinical Social Worker

## 2021-12-04 ENCOUNTER — Ambulatory Visit (HOSPITAL_COMMUNITY): Payer: Medicaid Other | Admitting: Physician Assistant

## 2023-08-28 ENCOUNTER — Ambulatory Visit (HOSPITAL_COMMUNITY): Admitting: Licensed Clinical Social Worker

## 2023-08-28 ENCOUNTER — Encounter (HOSPITAL_COMMUNITY): Payer: Self-pay

## 2023-09-07 ENCOUNTER — Other Ambulatory Visit: Payer: Self-pay

## 2023-09-07 ENCOUNTER — Emergency Department (HOSPITAL_COMMUNITY)
Admission: EM | Admit: 2023-09-07 | Discharge: 2023-09-07 | Disposition: A | Discharge status: Home / Self Care | Attending: Emergency Medicine | Admitting: Emergency Medicine

## 2023-09-07 DIAGNOSIS — H1189 Other specified disorders of conjunctiva: Secondary | ICD-10-CM | POA: Insufficient documentation

## 2023-09-07 DIAGNOSIS — H1032 Unspecified acute conjunctivitis, left eye: Secondary | ICD-10-CM

## 2023-09-07 DIAGNOSIS — H5712 Ocular pain, left eye: Secondary | ICD-10-CM | POA: Insufficient documentation

## 2023-09-07 MED ORDER — CIPROFLOXACIN HCL 0.3 % OP SOLN: 2.0000 [drp] | OPHTHALMIC | 0 refills | Status: AC

## 2023-09-07 MED ORDER — FLUORESCEIN SODIUM 1 MG OP STRP
1.0000 | ORAL_STRIP | Freq: Once | OPHTHALMIC | Status: AC
  Administered 2023-09-07: 1 via OPHTHALMIC
  Filled 2023-09-07: qty 1

## 2023-09-07 MED ORDER — TETRACAINE HCL 0.5 % OP SOLN
2.0000 [drp] | Freq: Once | OPHTHALMIC | Status: AC
  Administered 2023-09-07: 2 [drp] via OPHTHALMIC
  Filled 2023-09-07: qty 4

## 2023-09-07 NOTE — Discharge Instructions (Signed)
 Please follow-up with the ophthalmologist in regards to recent symptoms and ER visit.  Today your exam does not show any signs of purulent material or abrasions to the eye however we agreed to do antibiotic eyedrops that you may pick up at the pharmacy.  If symptoms change or worsen please return to the ER.

## 2023-09-07 NOTE — ED Notes (Signed)
 Reviewed D/C information with the patient, pt verbalized understanding. No additional concerns at this time.

## 2023-09-07 NOTE — ED Provider Notes (Signed)
 [place] EMERGENCY DEPARTMENT AT [facility] Provider Note   CSN: [id number] Arrival date & time: 09/07/23  1811     History  Chief Complaint  Patient presents with   Eye Problem    Left eye    Cheryl Baxter is a 33 y.o. female presenting with left eye pain that began this morning.  Patient states she had a tattoo next her left eye yesterday and then also recently got her eyelashes done as well.  Patient does wear contact lenses.  Patient denies any purulent discharge but states she has watery discharge.  Patient does endorse photosensitivity but denies any pain while in the dark, floaters, flashes of light, vision changes, headache, jaw claudication, fevers, trauma to the area.  Home Medications Prior to Admission medications   Medication Sig Start Date End Date Taking? Authorizing Provider  ARIPiprazole  (ABILIFY ) 5 MG tablet Take 1 tablet (5 mg total) by mouth daily. 10/19/21   Doda, Vandana, MD  escitalopram  (LEXAPRO ) 10 MG tablet Take 1 tablet (10 mg total) by mouth daily. 10/19/21   Doda, Vandana, MD  hydrOXYzine  (VISTARIL ) 25 MG capsule Take 1 capsule (25 mg total) by mouth at bedtime. 10/19/21   Doda, Vandana, MD  ibuprofen  (ADVIL ,MOTRIN ) 600 MG tablet Take 1 tablet (600 mg total) by mouth every 6 (six) hours as needed. 08/10/16   Lenord Radon, MD  Prenatal Vit-Fe Fumarate-FA (PREPLUS) 27-1 MG TABS Take 1 tablet by mouth daily. 01/19/16   Anyanwu, Ugonna A, MD      Allergies    Patient has no known allergies.    Review of Systems   Review of Systems  Physical Exam Updated Vital Signs BP (!) 84/56 (BP Location: Left Arm)   Pulse 66   Temp (!) 97.5 F (36.4 C)   Resp 16   Ht 5\' 1"  (1.549 m)   Wt 93.4 kg   SpO2 100%   BMI 38.91 kg/m  Physical Exam Constitutional:      General: She is not in acute distress. Eyes:     General: Lids are normal. Lids are everted, no foreign bodies appreciated. Vision grossly intact. Gaze aligned appropriately. No  visual field deficit.       Right eye: No foreign body, discharge or hordeolum.        Left eye: Discharge (Watery) present.No foreign body or hordeolum.     Extraocular Movements: Extraocular movements intact.     Conjunctiva/sclera:     Right eye: Right conjunctiva is not injected.     Left eye: Left conjunctiva is injected.     Pupils: Pupils are equal, round, and reactive to light.     Right eye: No corneal abrasion or fluorescein uptake. Seidel exam negative.     Left eye: No corneal abrasion or fluorescein uptake. Seidel exam negative.    Comments: No erythema or edema noted around the eyes No periorbital ecchymosis noted  Neurological:     Mental Status: She is alert.     ED Results / Procedures / Treatments   Labs (all labs ordered are listed, but only abnormal results are displayed) Labs Reviewed - No data to display  EKG None  Radiology No results found.  Procedures Procedures    Medications Ordered in ED Medications  fluorescein ophthalmic strip 1 strip (has no administration in time range)  tetracaine (PONTOCAINE) 0.5 % ophthalmic solution 2 drop (has no administration in time range)    ED Course/ Medical Decision Making/ A&P  Medical Decision Making Risk Prescription drug management.   Cheryl Baxter 33 y.o. presented today for eye pain.  Working DDx that I considered at this time includes, but not limited to, preorbital/orbital cellulitis, acute glaucoma, HSV infection, open globe, conjunctivitis, hordeolum/chalazion, FB, CRAO/CRVO.  R/o DDx: preorbital/orbital cellulitis, acute glaucoma, HSV infection, open globe, hordeolum/chalazion, FB, CRAO/CRVO: These are considered less likely due to history of present illness, physical exam, lab/imaging findings.  Review of prior external notes: Nodes which usually 3D  Unique Tests and My Independent Interpretation:  Visual Acuity: Bilateral 20/40 right eye 20/30 to left eye  20/50 Fluoroscein Stain: No reuptake  Social Determinants of Health: none  Discussion with Independent Historian: None  Discussion of Management of Tests: None  Risk: Medium: prescription drug management  Risk Stratification Score: None  Plan: On exam patient was in no acute distress with stable vitals however does appear uncomfortable.  EMR shows the patient has a blood pressure of 84 however patient blood pressure was 121 in the room and was diagnosed 121 previously and so do believe this is erroneous. Patient does wear contacts.  On exam patient does have injected left conjunctiva with watery discharge.  Patient does not have any temporal artery tenderness or jaw claudication on exam and has soft globe does not have any vision changes nor has any pain in the dark but is sensitive to light on exam.  Pupils are PERRL bilaterally and so will stain and get visual acuity to further assess.  Stain was unremarkable and eyelids were inverted and I do not see any abnormalities.  There are no eyelid abnormalities either such as styes present.  Visual acuity is reassuring as well.  Patient does state that she has not washed her hands and touched her eyes and thinks this may be the cause.  Will treat her for conjunctivitis and have her follow-up with an ophthalmologist.  Patient was given return precautions. Patient stable for discharge at this time.  Patient verbalized understanding of plan.  This chart was dictated using voice recognition software.  Despite best efforts to proofread,  errors can occur which can change the documentation meaning.         Final Clinical Impression(s) / ED Diagnoses Final diagnoses:  None    Rx / DC Orders ED Discharge Orders     None         Cheryl Baxter 09/07/23 2045    Cheryl Los, MD 09/08/23 [phone]

## 2023-09-07 NOTE — ED Triage Notes (Signed)
 Pt got a tattoo next to her left eye yesterday. Also recently got lashes done and had some irritation from contact lens. Today pt woke up and left eye is red and sensitive to light. No headache. Vision is unchanged. No drainage from eye

## 2023-09-25 ENCOUNTER — Other Ambulatory Visit: Payer: Self-pay

## 2023-09-25 ENCOUNTER — Encounter (HOSPITAL_COMMUNITY): Payer: Self-pay | Admitting: Emergency Medicine

## 2023-09-25 ENCOUNTER — Emergency Department (HOSPITAL_COMMUNITY)
Admission: EM | Admit: 2023-09-25 | Discharge: 2023-09-26 | Disposition: A | Discharge status: Other Acute Inpatient Hospital | Source: Home / Self Care | Attending: Emergency Medicine | Admitting: Emergency Medicine

## 2023-09-25 DIAGNOSIS — T426X2A Poisoning by other antiepileptic and sedative-hypnotic drugs, intentional self-harm, initial encounter: Secondary | ICD-10-CM | POA: Insufficient documentation

## 2023-09-25 DIAGNOSIS — X838XXA Intentional self-harm by other specified means, initial encounter: Secondary | ICD-10-CM | POA: Insufficient documentation

## 2023-09-25 DIAGNOSIS — F1490 Cocaine use, unspecified, uncomplicated: Secondary | ICD-10-CM | POA: Insufficient documentation

## 2023-09-25 DIAGNOSIS — T50904A Poisoning by unspecified drugs, medicaments and biological substances, undetermined, initial encounter: Secondary | ICD-10-CM

## 2023-09-25 DIAGNOSIS — E876 Hypokalemia: Secondary | ICD-10-CM | POA: Insufficient documentation

## 2023-09-25 DIAGNOSIS — F29 Unspecified psychosis not due to a substance or known physiological condition: Secondary | ICD-10-CM | POA: Insufficient documentation

## 2023-09-25 LAB — CBC
HCT: 38 % (ref 36.0–46.0)
Hemoglobin: 12.3 g/dL (ref 12.0–15.0)
MCH: 27.4 pg (ref 26.0–34.0)
MCHC: 32.4 g/dL (ref 30.0–36.0)
MCV: 84.6 fL (ref 80.0–100.0)
Platelets: 284 10*3/uL (ref 150–400)
RBC: 4.49 MIL/uL (ref 3.87–5.11)
RDW: 13.8 % (ref 11.5–15.5)
WBC: 8.9 10*3/uL (ref 4.0–10.5)
nRBC: 0 % (ref 0.0–0.2)

## 2023-09-25 LAB — URINALYSIS, ROUTINE W REFLEX MICROSCOPIC
Bilirubin Urine: NEGATIVE
Glucose, UA: NEGATIVE mg/dL
Hgb urine dipstick: NEGATIVE
Ketones, ur: NEGATIVE mg/dL
Leukocytes,Ua: NEGATIVE
Nitrite: NEGATIVE
Protein, ur: NEGATIVE mg/dL
Specific Gravity, Urine: 1.006 (ref 1.005–1.030)
pH: 6 (ref 5.0–8.0)

## 2023-09-25 LAB — COMPREHENSIVE METABOLIC PANEL WITH GFR
ALT: 25 U/L (ref 0–44)
AST: 28 U/L (ref 15–41)
Albumin: 3.9 g/dL (ref 3.5–5.0)
Alkaline Phosphatase: 64 U/L (ref 38–126)
Anion gap: 10 (ref 5–15)
BUN: 11 mg/dL (ref 6–20)
CO2: 23 mmol/L (ref 22–32)
Calcium: 8.9 mg/dL (ref 8.9–10.3)
Chloride: 108 mmol/L (ref 98–111)
Creatinine, Ser: 0.91 mg/dL (ref 0.44–1.00)
GFR, Estimated: >60 mL/min
Glucose, Bld: 86 mg/dL (ref 70–99)
Potassium: 3.1 mmol/L — ABNORMAL LOW (ref 3.5–5.1)
Sodium: 141 mmol/L (ref 135–145)
Total Bilirubin: 0.6 mg/dL (ref 0.0–1.2)
Total Protein: 7.9 g/dL (ref 6.5–8.1)

## 2023-09-25 LAB — MAGNESIUM: Magnesium: 2.1 mg/dL (ref 1.7–2.4)

## 2023-09-25 LAB — HCG, SERUM, QUALITATIVE: Preg, Serum: NEGATIVE

## 2023-09-25 LAB — ETHANOL: Alcohol, Ethyl (B): <15 mg/dL

## 2023-09-25 MED ORDER — DROPERIDOL 2.5 MG/ML IJ SOLN: 5.0000 mg | Freq: Once | INTRAMUSCULAR | Status: DC

## 2023-09-25 MED ORDER — ESCITALOPRAM OXALATE 10 MG PO TABS
10.0000 mg | ORAL_TABLET | Freq: Every day | ORAL | Status: DC
  Administered 2023-09-26: 10 mg via ORAL
  Filled 2023-09-25: qty 1

## 2023-09-25 MED ORDER — ARIPIPRAZOLE 5 MG PO TABS
5.0000 mg | ORAL_TABLET | Freq: Every day | ORAL | Status: DC
  Administered 2023-09-26: 5 mg via ORAL
  Filled 2023-09-25: qty 1

## 2023-09-25 MED ORDER — MIDAZOLAM HCL 2 MG/2ML IJ SOLN
2.0000 mg | Freq: Once | INTRAMUSCULAR | Status: AC
  Administered 2023-09-25: 2 mg via INTRAMUSCULAR
  Filled 2023-09-25: qty 2

## 2023-09-25 MED ORDER — HYDROXYZINE HCL 25 MG PO TABS
25.0000 mg | ORAL_TABLET | Freq: Three times a day (TID) | ORAL | Status: DC | PRN
  Administered 2023-09-26: 25 mg via ORAL
  Filled 2023-09-25: qty 1

## 2023-09-25 MED ORDER — POTASSIUM CHLORIDE CRYS ER 20 MEQ PO TBCR
20.0000 meq | EXTENDED_RELEASE_TABLET | Freq: Every day | ORAL | Status: DC
  Administered 2023-09-26: 20 meq via ORAL
  Filled 2023-09-25: qty 1

## 2023-09-25 MED ORDER — ZIPRASIDONE MESYLATE 20 MG IM SOLR
20.0000 mg | Freq: Once | INTRAMUSCULAR | Status: AC
  Administered 2023-09-25: 20 mg via INTRAMUSCULAR
  Filled 2023-09-25: qty 20

## 2023-09-25 MED ORDER — IBUPROFEN 200 MG PO TABS: 600.0000 mg | ORAL_TABLET | Freq: Three times a day (TID) | ORAL | Status: DC | PRN

## 2023-09-25 MED ORDER — STERILE WATER FOR INJECTION IJ SOLN
INTRAMUSCULAR | Status: AC
  Filled 2023-09-25: qty 10

## 2023-09-25 NOTE — ED Provider Notes (Addendum)
 [place] EMERGENCY DEPARTMENT AT [facility] Provider Note   CSN: [id number] Arrival date & time: 09/25/23  1840     Patient presents with: Suicide Attempt (Ingestion Overdose)   Cheryl Baxter is a 33 y.o. female presenting to ED with concern for drug overdose.  Supplemental history provided by the patient's mother, who received a phone call from the patient telling her that the patient had ingested 6 of her sleeping pills and also used cocaine.  The patient lives with children.  Her mother came to the house and is now watching the children.  The patient was brought in by EMS, extremely agitated, screaming and sobbing I love my kids, I'm so sorry, help me! And cannot or will not provide further information.  Her mother by phone tells me that the patient was living in [place]  before him, she believes there may have been a history of mental health hospitalizations.  She is not certain which medications the patient take. Her mother says the patient told her she wanted to kill herself on the phone when she called.  PDMP reviewed - no prescriptions filled   HPI     Prior to Admission medications   Medication Sig Start Date End Date Taking? Authorizing Provider  ARIPiprazole  (ABILIFY ) 5 MG tablet Take 1 tablet (5 mg total) by mouth daily. 10/19/21   Doda, Vandana, MD  escitalopram  (LEXAPRO ) 10 MG tablet Take 1 tablet (10 mg total) by mouth daily. 10/19/21   Doda, Vandana, MD  hydrOXYzine  (VISTARIL ) 25 MG capsule Take 1 capsule (25 mg total) by mouth at bedtime. 10/19/21   Doda, Vandana, MD  ibuprofen  (ADVIL ,MOTRIN ) 600 MG tablet Take 1 tablet (600 mg total) by mouth every 6 (six) hours as needed. 08/10/16   Lenord Radon, MD  Prenatal Vit-Fe Fumarate-FA (PREPLUS) 27-1 MG TABS Take 1 tablet by mouth daily. 01/19/16   Anyanwu, Ugonna A, MD    Allergies: Penicillins    Review of Systems  Updated Vital Signs BP 137/78   Pulse 96   Resp 15   Ht 5' 1 (1.549 m)   Wt 93.4  kg   SpO2 96%   BMI 38.91 kg/m   Physical Exam Constitutional:      General: She is not in acute distress.    Comments: Tearful, shouting, hyperventilating  HENT:     Head: Normocephalic and atraumatic.   Eyes:     Conjunctiva/sclera: Conjunctivae normal.     Pupils: Pupils are equal, round, and reactive to light.    Cardiovascular:     Rate and Rhythm: Regular rhythm. Tachycardia present.  Pulmonary:     Effort: Pulmonary effort is normal. No respiratory distress.  Abdominal:     General: There is no distension.     Tenderness: There is no abdominal tenderness.   Skin:    General: Skin is warm and dry.   Neurological:     Mental Status: She is alert.     Comments: Speech is clear, moving all extremities    (all labs ordered are listed, but only abnormal results are displayed) Labs Reviewed  COMPREHENSIVE METABOLIC PANEL WITH GFR - Abnormal; Notable for the following components:      Result Value   Potassium 3.1 (*)    All other components within normal limits  MAGNESIUM  ETHANOL  HCG, SERUM, QUALITATIVE  CBC  RAPID URINE DRUG SCREEN, HOSP PERFORMED  URINALYSIS, ROUTINE W REFLEX MICROSCOPIC    EKG: EKG Interpretation Date/Time:  Thursday September 25 2023 18:45:36 EDT Ventricular Rate:  171 PR Interval:  112 QRS Duration:  105 QT Interval:  311 QTC Calculation: 525 R Axis:   84  Text Interpretation: Sinus tachycardia Multiple premature complexes, vent & supraven Aberrant conduction of SV complex(es) Probable anteroseptal infarct, old Prolonged QT interval Confirmed by Jerald Molly [id number]) on 09/25/2023 6:53:11 PM  Radiology: No results found.   Procedures   Medications Ordered in the ED  escitalopram  (LEXAPRO ) tablet 10 mg (has no administration in time range)  hydrOXYzine  (ATARAX ) tablet 25 mg (has no administration in time range)  ARIPiprazole  (ABILIFY ) tablet 5 mg (has no administration in time range)  ibuprofen  (ADVIL ) tablet 600 mg (has no  administration in time range)  potassium chloride SA (KLOR-CON M) CR tablet 20 mEq (has no administration in time range)  midazolam (VERSED) injection 2 mg (2 mg Intramuscular Given 09/25/23 1923)  ziprasidone (GEODON) injection 20 mg (20 mg Intramuscular Given 09/25/23 1923)  sterile water (preservative free) injection (  Given 09/25/23 1952)    Clinical Course as of 09/25/23 2317  Thu Sep 25, 2023  1851 Patient extremely agitated, sobbing, screaming I love my kids, hyperventilating. I cannot redirect her or calm her down.  She's tachycardic with the agitation.  SPO2 100%.  IM sedatives ordered [MT]  1912 Unable to get BP initially due to agitation, will attempt after sedation [MT]  2310 Pt placed under IVC for TTS evaluation given high risk behavior for self harm; she is calmer now, was napping but awoke and walked to bathroom. [MT]    Clinical Course User Index [MT] Delayza Lungren, Janalyn Me, MD                                 Medical Decision Making Amount and/or Complexity of Data Reviewed Labs: ordered.  Risk OTC drugs. Prescription drug management.   This patient presents to the Emergency Department with complaint of psychiatric disturbance. This involves an extensive number of treatment options, and is a complaint that carries with it a high risk of complications and morbidity.   I ordered, reviewed, and interpreted labs, including BMP and CBC.  There were no immediate, life-threatening emergencies found in this labwork.  The patient was medically cleared for TTS and psychiatric evaluation.  At this time, the patient IS under IVC.  Additional history was obtained from EMS, patient's mother  I personally reviewed the patients ECG which showed sinus rhythm with no acute ischemic findings  Patient is medically cleared for TTS evaluation.  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  Home meds ordered   K mildly low - repletion ordered      Final diagnoses:  Drug overdose of undetermined intent, initial encounter  Cocaine use  Hypokalemia    ED Discharge Orders     None         Raylin Winer, Janalyn Me, MD 09/25/23 2317    Arvilla Birmingham, MD 09/25/23 2326

## 2023-09-25 NOTE — ED Triage Notes (Signed)
 Pt. BIBEMS from home. Pt. Stated she took cocaine and marijuana. Pt. Also stated that she was attempting to harm herself but not her children or anyone else.

## 2023-09-26 ENCOUNTER — Encounter (HOSPITAL_COMMUNITY): Payer: Self-pay | Admitting: Psychiatry

## 2023-09-26 ENCOUNTER — Inpatient Hospital Stay (HOSPITAL_COMMUNITY)
Admission: AD | Admit: 2023-09-26 | Discharge: 2023-10-04 | DRG: 918 | Disposition: A | Discharge status: Home / Self Care | Source: Intra-hospital | Attending: Psychiatry | Admitting: Psychiatry

## 2023-09-26 ENCOUNTER — Other Ambulatory Visit: Payer: Self-pay

## 2023-09-26 DIAGNOSIS — E876 Hypokalemia: Secondary | ICD-10-CM | POA: Diagnosis present

## 2023-09-26 DIAGNOSIS — F315 Bipolar disorder, current episode depressed, severe, with psychotic features: Secondary | ICD-10-CM | POA: Diagnosis present

## 2023-09-26 DIAGNOSIS — T50902A Poisoning by unspecified drugs, medicaments and biological substances, intentional self-harm, initial encounter: Secondary | ICD-10-CM | POA: Diagnosis present

## 2023-09-26 DIAGNOSIS — F1210 Cannabis abuse, uncomplicated: Secondary | ICD-10-CM | POA: Diagnosis present

## 2023-09-26 DIAGNOSIS — Z560 Unemployment, unspecified: Secondary | ICD-10-CM

## 2023-09-26 DIAGNOSIS — F1410 Cocaine abuse, uncomplicated: Secondary | ICD-10-CM | POA: Diagnosis present

## 2023-09-26 DIAGNOSIS — F329 Major depressive disorder, single episode, unspecified: Principal | ICD-10-CM | POA: Diagnosis present

## 2023-09-26 DIAGNOSIS — J45909 Unspecified asthma, uncomplicated: Secondary | ICD-10-CM | POA: Diagnosis present

## 2023-09-26 DIAGNOSIS — F17290 Nicotine dependence, other tobacco product, uncomplicated: Secondary | ICD-10-CM | POA: Diagnosis present

## 2023-09-26 DIAGNOSIS — Z79899 Other long term (current) drug therapy: Secondary | ICD-10-CM | POA: Diagnosis not present

## 2023-09-26 DIAGNOSIS — F4312 Post-traumatic stress disorder, chronic: Secondary | ICD-10-CM | POA: Diagnosis present

## 2023-09-26 DIAGNOSIS — T4272XA Poisoning by unspecified antiepileptic and sedative-hypnotic drugs, intentional self-harm, initial encounter: Secondary | ICD-10-CM | POA: Diagnosis present

## 2023-09-26 DIAGNOSIS — F5104 Psychophysiologic insomnia: Secondary | ICD-10-CM | POA: Diagnosis present

## 2023-09-26 LAB — RAPID URINE DRUG SCREEN, HOSP PERFORMED
Amphetamines: NOT DETECTED
Barbiturates: NOT DETECTED
Benzodiazepines: POSITIVE — AB
Cocaine: POSITIVE — AB
Opiates: NOT DETECTED
Tetrahydrocannabinol: POSITIVE — AB

## 2023-09-26 MED ORDER — DIPHENHYDRAMINE HCL 50 MG/ML IJ SOLN: 50.0000 mg | Freq: Three times a day (TID) | INTRAMUSCULAR | Status: DC | PRN

## 2023-09-26 MED ORDER — ESCITALOPRAM OXALATE 10 MG PO TABS
10.0000 mg | ORAL_TABLET | Freq: Every day | ORAL | Status: DC
  Filled 2023-09-26: qty 1

## 2023-09-26 MED ORDER — MAGNESIUM HYDROXIDE 400 MG/5ML PO SUSP: 30.0000 mL | Freq: Every day | ORAL | Status: DC | PRN

## 2023-09-26 MED ORDER — POTASSIUM CHLORIDE CRYS ER 20 MEQ PO TBCR
20.0000 meq | EXTENDED_RELEASE_TABLET | Freq: Every day | ORAL | Status: AC
  Administered 2023-09-27 – 2023-09-30 (×4): 20 meq via ORAL
  Filled 2023-09-26 (×4): qty 1

## 2023-09-26 MED ORDER — DIPHENHYDRAMINE HCL 25 MG PO CAPS: 50.0000 mg | ORAL_CAPSULE | Freq: Three times a day (TID) | ORAL | Status: DC | PRN

## 2023-09-26 MED ORDER — ARIPIPRAZOLE 5 MG PO TABS
5.0000 mg | ORAL_TABLET | Freq: Every day | ORAL | Status: DC
  Filled 2023-09-26: qty 1

## 2023-09-26 MED ORDER — LORAZEPAM 2 MG/ML IJ SOLN: 2.0000 mg | Freq: Three times a day (TID) | INTRAMUSCULAR | Status: DC | PRN

## 2023-09-26 MED ORDER — HYDROXYZINE HCL 50 MG PO TABS
50.0000 mg | ORAL_TABLET | Freq: Once | ORAL | Status: AC
  Filled 2023-09-26: qty 1

## 2023-09-26 MED ORDER — ALUM & MAG HYDROXIDE-SIMETH 200-200-20 MG/5ML PO SUSP: 30.0000 mL | ORAL | Status: DC | PRN

## 2023-09-26 MED ORDER — ACETAMINOPHEN 325 MG PO TABS: 650.0000 mg | ORAL_TABLET | Freq: Four times a day (QID) | ORAL | Status: DC | PRN

## 2023-09-26 MED ORDER — HALOPERIDOL 5 MG PO TABS: 5.0000 mg | ORAL_TABLET | Freq: Three times a day (TID) | ORAL | Status: DC | PRN

## 2023-09-26 MED ORDER — HYDROXYZINE HCL 25 MG PO TABS
25.0000 mg | ORAL_TABLET | Freq: Three times a day (TID) | ORAL | Status: DC | PRN
  Administered 2023-09-28 – 2023-10-03 (×3): 25 mg via ORAL
  Filled 2023-09-26 (×6): qty 1

## 2023-09-26 MED ORDER — HALOPERIDOL LACTATE 5 MG/ML IJ SOLN: 5.0000 mg | Freq: Three times a day (TID) | INTRAMUSCULAR | Status: DC | PRN

## 2023-09-26 MED ORDER — HALOPERIDOL LACTATE 5 MG/ML IJ SOLN: 10.0000 mg | Freq: Three times a day (TID) | INTRAMUSCULAR | Status: DC | PRN

## 2023-09-26 NOTE — Progress Notes (Signed)
 Pt has been accepted to [facility] on 09/26/2023 Bed assignment: 406-1  Pt meets inpatient criteria per: Dorthea Gauze NP  Attending Physician will be: Dr. Zouez   Report can be called to:-Adult unit: [phone]  Pt can arrive after 2 PM   Care Team Notified: [facility] RN, [place], PMHNP, Patt Boozer [facility],  [place] Jaylyn Booher LCSW-A   09/26/2023 11:30 AM

## 2023-09-26 NOTE — ED Notes (Signed)
Pt was given breakfast tray 

## 2023-09-26 NOTE — Group Note (Signed)
 Date:  09/26/2023 Time:  10:21 PM  Group Topic/Focus:  Goals Group:   The focus of this group is to help patients establish daily goals to achieve during treatment and discuss how the patient can incorporate goal setting into their daily lives to aide in recovery. Wrap-Up Group:   The focus of this group is to help patients review their daily goal of treatment and discuss progress on daily workbooks.    Participation Level:  Active  Participation Quality:  Sharing  Affect:  Appropriate  Cognitive:  Appropriate  Insight: Good  Engagement in Group:  Engaged  Modes of Intervention:  Discussion  Additional Comments:    Joann Mu 09/26/2023, 10:21 PM
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
## 2023-09-26 NOTE — ED Notes (Signed)
 [facility] called for transport

## 2023-09-26 NOTE — Tx Team (Signed)
 Initial Treatment Plan 09/26/2023 8:08 PM Danya Rochelle Chu GEX:[id number]    PATIENT STRESSORS: Medication change or noncompliance   Substance abuse   Traumatic event     PATIENT STRENGTHS: Capable of independent living  Communication skills  Physical Health  Supportive family/friends    PATIENT IDENTIFIED PROBLEMS: Medication noncompliance I take my medications as needed. I want everything to be natural like God meant it to be.    Grief I lost my child's father in 2022, I need to work on moving on from it.    Alterations in mood I want to work on not being angry about my past.    Substance Use / Abuse I smoke marijuana daily, it helps me stay afloat. I drink 3-5 glasses of wine occasionally.         DISCHARGE CRITERIA:  Improved stabilization in mood, thinking, and/or behavior Verbal commitment to aftercare and medication compliance Withdrawal symptoms are absent or subacute and managed without 24-hour nursing intervention  PRELIMINARY DISCHARGE PLAN: Outpatient therapy Return to previous living arrangement  PATIENT/FAMILY INVOLVEMENT: This treatment plan has been presented to and reviewed with the patient, Wyonia Fontanella. The patient have been given the opportunity to ask questions and make suggestions.  Janila Arrazola, RN 09/26/2023, 8:08 PM

## 2023-09-26 NOTE — ED Notes (Signed)
 Pt belongings placed in hall d cabinet.
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
## 2023-09-26 NOTE — ED Provider Notes (Signed)
 Emergency Medicine Observation Re-evaluation Note  Cheryl Baxter is a 33 y.o. female, seen on rounds today.  Pt initially presented to the ED for complaints of Suicide Attempt (Ingestion Overdose) Currently, the patient is resting.  Physical Exam  BP 120/76   Pulse 86   Temp 97.6 F (36.4 C) (Oral)   Resp 17   Ht 5' 1 (1.549 m)   Wt 93.4 kg   SpO2 98%   BMI 38.91 kg/m  Physical Exam General: NAD   ED Course / MDM  EKG:EKG Interpretation Date/Time:  Thursday September 25 2023 18:45:36 EDT Ventricular Rate:  171 PR Interval:  112 QRS Duration:  105 QT Interval:  311 QTC Calculation: 525 R Axis:   84  Text Interpretation: Sinus tachycardia Multiple premature complexes, vent & supraven Aberrant conduction of SV complex(es) Probable anteroseptal infarct, old Prolonged QT interval Confirmed by Jerald Molly [phone]) on 09/25/2023 6:53:11 PM  I have reviewed the labs performed to date as well as medications administered while in observation.  Recent changes in the last 24 hours include no acute events reported.  Plan  Current plan is for psych placement.    Burnette Carte, MD 09/26/23 [phone]
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
## 2023-09-27 DIAGNOSIS — F315 Bipolar disorder, current episode depressed, severe, with psychotic features: Secondary | ICD-10-CM | POA: Diagnosis not present

## 2023-09-27 DIAGNOSIS — F1410 Cocaine abuse, uncomplicated: Secondary | ICD-10-CM

## 2023-09-27 DIAGNOSIS — F4312 Post-traumatic stress disorder, chronic: Secondary | ICD-10-CM

## 2023-09-27 MED ORDER — QUETIAPINE FUMARATE 50 MG PO TABS
50.0000 mg | ORAL_TABLET | Freq: Four times a day (QID) | ORAL | Status: DC | PRN
  Administered 2023-09-28: 50 mg via ORAL
  Filled 2023-09-27: qty 1

## 2023-09-27 MED ORDER — ALBUTEROL SULFATE HFA 108 (90 BASE) MCG/ACT IN AERS: 1.0000 | INHALATION_SPRAY | RESPIRATORY_TRACT | Status: DC | PRN

## 2023-09-27 MED ORDER — WHITE PETROLATUM EX OINT
TOPICAL_OINTMENT | CUTANEOUS | Status: AC
  Filled 2023-09-27: qty 5

## 2023-09-27 MED ORDER — QUETIAPINE FUMARATE 50 MG PO TABS
50.0000 mg | ORAL_TABLET | Freq: Two times a day (BID) | ORAL | Status: DC
  Administered 2023-09-28: 50 mg via ORAL
  Filled 2023-09-27 (×2): qty 1

## 2023-09-27 MED ORDER — QUETIAPINE FUMARATE 50 MG PO TABS
50.0000 mg | ORAL_TABLET | Freq: Once | ORAL | Status: AC
  Filled 2023-09-27: qty 1

## 2023-09-27 NOTE — Plan of Care (Signed)
   Problem: Education: Goal: Emotional status will improve Outcome: Not Progressing Goal: Mental status will improve Outcome: Not Progressing
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
## 2023-09-27 NOTE — Progress Notes (Signed)
 On assessment, patient presented with elevated anxiety and requested medication for sleep.  Administered PRN Hydroxyzine  and one-time dose Hydroxyzine  per [facility] per patient request.

## 2023-09-27 NOTE — Progress Notes (Signed)
   09/26/23 2125  Psych Admission Type (Psych Patients Only)  Admission Status Involuntary  Psychosocial Assessment  Patient Complaints Anxiety;Suspiciousness  Eye Contact Fair  Facial Expression Anxious  Affect Anxious  Speech Tangential;Pressured  Interaction Assertive  Motor Activity Fidgety;Restless  Appearance/Hygiene In hospital gown  Behavior Characteristics Cooperative  Mood Anxious;Preoccupied;Suspicious  Thought Process  Coherency Circumstantial;Tangential  Content Blaming others;Blaming self  Delusions Paranoid  Perception Hallucinations  Hallucination Auditory;Visual  Judgment Impaired  Confusion Mild  Danger to Self  Current suicidal ideation? Denies  Agreement Not to Harm Self Yes  Description of Agreement verbal  Danger to Others  Danger to Others None reported or observed

## 2023-09-27 NOTE — Plan of Care (Signed)
   Problem: Education: Goal: Emotional status will improve Outcome: Progressing Goal: Mental status will improve Outcome: Progressing

## 2023-09-27 NOTE — Plan of Care (Signed)
  Problem: Coping: Goal: Ability to demonstrate self-control will improve Outcome: Progressing   Problem: Safety: Goal: Periods of time without injury will increase Outcome: Progressing   Problem: Activity: Goal: Sleeping patterns will improve Outcome: Progressing
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
## 2023-09-27 NOTE — Progress Notes (Signed)
 Adult Psychoeducational Group Note  Date:  09/27/2023 Time:  10:25 AM  Group Topic/Focus:  Goals Group:   The focus of this group is to help patients establish daily goals to achieve during treatment and discuss how the patient can incorporate goal setting into their daily lives to aide in recovery.  Participation Level:  Active  Participation Quality:  Appropriate  Affect:  Appropriate  Cognitive:  Appropriate  Insight: Appropriate  Engagement in Group:  Engaged  Modes of Intervention:  Discussion  Additional Comments:  Pt stated she is doing okay.  Pt goal for the day is to possibly discharge.  Butch Cashing D 09/27/2023, 10:25 AM

## 2023-09-27 NOTE — Progress Notes (Signed)
   09/27/23 2100  Psych Admission Type (Psych Patients Only)  Admission Status Involuntary  Psychosocial Assessment  Patient Complaints Anxiety;Suspiciousness  Eye Contact Fair  Facial Expression Animated;Anxious  Affect Appropriate to circumstance  Speech Tangential;Pressured  Interaction Assertive  Motor Activity Fidgety;Restless  Appearance/Hygiene Unremarkable  Behavior Characteristics Cooperative  Mood Anxious;Depressed;Suspicious  Thought Process  Coherency Circumstantial;Tangential  Content Blaming self;Blaming others  Delusions Paranoid  Perception Hallucinations  Hallucination Auditory  Judgment Impaired  Confusion None  Danger to Self  Current suicidal ideation? Denies  Danger to Others  Danger to Others None reported or observed
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
## 2023-09-28 DIAGNOSIS — F1410 Cocaine abuse, uncomplicated: Secondary | ICD-10-CM | POA: Diagnosis not present

## 2023-09-28 DIAGNOSIS — F4312 Post-traumatic stress disorder, chronic: Secondary | ICD-10-CM | POA: Diagnosis not present

## 2023-09-28 DIAGNOSIS — F315 Bipolar disorder, current episode depressed, severe, with psychotic features: Secondary | ICD-10-CM | POA: Diagnosis not present

## 2023-09-28 LAB — LIPID PANEL
Cholesterol: 199 mg/dL (ref 0–200)
HDL: 71 mg/dL (ref >40)
LDL Cholesterol: 111 mg/dL — ABNORMAL HIGH (ref 0–99)
Total CHOL/HDL Ratio: 2.8 ratio
Triglycerides: 83 mg/dL (ref <150)
VLDL: 17 mg/dL (ref 0–40)

## 2023-09-28 LAB — HEMOGLOBIN A1C
Hgb A1c MFr Bld: 4.9 % (ref 4.8–5.6)
Mean Plasma Glucose: 93.93 mg/dL

## 2023-09-28 LAB — TSH: TSH: 1.715 u[IU]/mL (ref 0.350–4.500)

## 2023-09-28 MED ORDER — QUETIAPINE FUMARATE 50 MG PO TABS: 150.0000 mg | ORAL_TABLET | Freq: Every day | ORAL | Status: DC

## 2023-09-28 MED ORDER — QUETIAPINE FUMARATE 50 MG PO TABS
50.0000 mg | ORAL_TABLET | Freq: Every day | ORAL | Status: DC
  Administered 2023-09-29: 50 mg via ORAL
  Filled 2023-09-28: qty 1

## 2023-09-28 MED ORDER — TRAZODONE HCL 50 MG PO TABS
50.0000 mg | ORAL_TABLET | Freq: Once | ORAL | Status: AC
  Administered 2023-09-28: 50 mg via ORAL
  Filled 2023-09-28: qty 1

## 2023-09-28 NOTE — Progress Notes (Signed)
 During this shift, administered PRN Hydroxyzine  for anxiety per [facility] per patient request.

## 2023-09-28 NOTE — Progress Notes (Signed)
 Pt up requesting something to help her sleep, since she did not have anything ordered for sleep, NP notified

## 2023-09-28 NOTE — Progress Notes (Signed)
 [facility] MD Progress Note  09/28/2023 11:50 AM Cheryl Baxter  MRN:  [id number] Subjective:   Patient seen for evaluation, patient states she slept well. Patient attending groups. Patient is irritable, demanding discharge. Appears tearful and dysphoric. Minimizing her suicide attempt and substance abuse. Patient gives me permission to speak with her mother and I called mother and left a voicemail. Patient states she is fine and only used cocaine a little and doesn't need rehab services. Patient claims she has a psychiatrist but then admits she has yet to establish with a psychiatrist or therapist in [place]. Discussed importance of outpatient followup arrangements when she is stable. Refuses inpatient rehab stating it's not a problem. Denies any cravings. Patient denies AVH today but appears on edge.     Principal Problem: Bipolar I disorder, current or most recent episode depressed, with psychotic features (HCC) Diagnosis: Principal Problem:   Bipolar I disorder, current or most recent episode depressed, with psychotic features (HCC) Active Problems:   Cocaine use disorder, mild, abuse (HCC)   Chronic post-traumatic stress disorder (PTSD)  Total Time spent with patient: 30 minutes  Past Psychiatric History: see H&P  Past Medical History:  Past Medical History:  Diagnosis Date   Anemia    Herpes genitalia    HIV exposure 12/2015    Past Surgical History:  Procedure Laterality Date   NO PAST SURGERIES     Family History: History reviewed. No pertinent family history. Family Psychiatric  History: see H&P Social History:  Social History   Substance and Sexual Activity  Alcohol Use Yes     Social History   Substance and Sexual Activity  Drug Use No    Social History   Socioeconomic History   Marital status: Single    Spouse name: Not on file   Number of children: Not on file   Years of education: Not on file   Highest education level: Not on file  Occupational History   Not  on file  Tobacco Use   Smoking status: Every Day    Types: Cigars   Smokeless tobacco: Never  Substance and Sexual Activity   Alcohol use: Yes   Drug use: No   Sexual activity: Yes    Birth control/protection: None  Other Topics Concern   Not on file  Social History Narrative   Not on file   Social Drivers of Health   Financial Resource Strain: Not on file  Food Insecurity: No Food Insecurity (09/26/2023)   Hunger Vital Sign    Worried About Running Out of Food in the Last Year: Never true    Ran Out of Food in the Last Year: Never true  Transportation Needs: No Transportation Needs (09/26/2023)   PRAPARE - Administrator, Civil Service (Medical): No    Lack of Transportation (Non-Medical): No  Physical Activity: Not on file  Stress: Not on file  Social Connections: Not on file   Additional Social History:                         Sleep: Fair Estimated Sleeping Duration (Last 24 Hours): 7.25-8.00 hours  Appetite:  Fair  Current Medications: Current Facility-Administered Medications  Medication Dose Route Frequency Provider Last Rate Last Admin   acetaminophen  (TYLENOL ) tablet 650 mg  650 mg Oral Q6H PRN Motley-Mangrum, Jadeka A, PMHNP       albuterol (VENTOLIN HFA) 108 (90 Base) MCG/ACT inhaler 1 puff  1 puff Inhalation Q4H PRN  Velvie Thomaston, MD       alum & mag hydroxide-simeth (MAALOX/MYLANTA) 200-200-20 MG/5ML suspension 30 mL  30 mL Oral Q4H PRN Motley-Mangrum, Jadeka A, PMHNP       haloperidol (HALDOL) tablet 5 mg  5 mg Oral TID PRN Motley-Mangrum, Jadeka A, PMHNP       And   diphenhydrAMINE  (BENADRYL ) capsule 50 mg  50 mg Oral TID PRN Motley-Mangrum, Jadeka A, PMHNP       haloperidol lactate (HALDOL) injection 5 mg  5 mg Intramuscular TID PRN Motley-Mangrum, Jadeka A, PMHNP       And   diphenhydrAMINE  (BENADRYL ) injection 50 mg  50 mg Intramuscular TID PRN Motley-Mangrum, Jadeka A, PMHNP       And   LORazepam (ATIVAN) injection 2 mg  2 mg  Intramuscular TID PRN Motley-Mangrum, Jadeka A, PMHNP       haloperidol lactate (HALDOL) injection 10 mg  10 mg Intramuscular TID PRN Motley-Mangrum, Jadeka A, PMHNP       And   diphenhydrAMINE  (BENADRYL ) injection 50 mg  50 mg Intramuscular TID PRN Motley-Mangrum, Jadeka A, PMHNP       And   LORazepam (ATIVAN) injection 2 mg  2 mg Intramuscular TID PRN Motley-Mangrum, Jadeka A, PMHNP       hydrOXYzine  (ATARAX ) tablet 25 mg  25 mg Oral TID PRN Motley-Mangrum, Jadeka A, PMHNP   25 mg at 09/27/23 2059   magnesium hydroxide (MILK OF MAGNESIA) suspension 30 mL  30 mL Oral Daily PRN Motley-Mangrum, Jadeka A, PMHNP       potassium chloride  SA (KLOR-CON  M) CR tablet 20 mEq  20 mEq Oral Daily Motley-Mangrum, Jadeka A, PMHNP   20 mEq at 09/28/23 0809   [START ON 09/29/2023] QUEtiapine (SEROQUEL) tablet 150 mg  150 mg Oral QHS Powell Halbert, MD       QUEtiapine (SEROQUEL) tablet 50 mg  50 mg Oral Q6H PRN Alixandria Friedt, MD       [START ON 09/29/2023] QUEtiapine (SEROQUEL) tablet 50 mg  50 mg Oral Daily Nitzia Perren, MD        Lab Results:  Results for orders placed or performed during the hospital encounter of 09/26/23 (from the past 48 hours)  TSH     Status: None   Collection Time: 09/28/23  6:22 AM  Result Value Ref Range   TSH 1.715 0.350 - 4.500 uIU/mL    Comment: Performed by a 3rd Generation assay with a functional sensitivity of <=0.01 uIU/mL. Performed at [facility], [address], [place], [place] [address]   Hemoglobin A1c     Status: None   Collection Time: 09/28/23  6:22 AM  Result Value Ref Range   Hgb A1c MFr Bld 4.9 4.8 - 5.6 %    Comment: (NOTE) Diagnosis of Diabetes The following HbA1c ranges recommended by the American Diabetes Association (ADA) may be used as an aid in the diagnosis of diabetes mellitus.  Hemoglobin             Suggested A1C NGSP%              Diagnosis  <5.7                   Non Diabetic  5.7-6.4                 Pre-Diabetic  >6.4                   Diabetic  <7.0  Glycemic control for                       adults with diabetes.     Mean Plasma Glucose 93.93 mg/dL    Comment: Performed at [facility], [address] [place], [place] [address]  Lipid panel     Status: Abnormal   Collection Time: 09/28/23  6:22 AM  Result Value Ref Range   Cholesterol 199 0 - 200 mg/dL   Triglycerides 83 <604 mg/dL   HDL 71 >54 mg/dL   Total CHOL/HDL Ratio 2.8 RATIO   VLDL 17 0 - 40 mg/dL   LDL Cholesterol 098 (H) 0 - 99 mg/dL    Comment:        Total Cholesterol/HDL:CHD Risk Coronary Heart Disease Risk Table                     Men   Women  1/2 Average Risk   3.4   3.3  Average Risk       5.0   4.4  2 X Average Risk   9.6   7.1  3 X Average Risk  23.4   11.0        Use the calculated Patient Ratio above and the CHD Risk Table to determine the patient's CHD Risk.        ATP III CLASSIFICATION (LDL):  <100     mg/dL   Optimal  119-147  mg/dL   Near or Above                    Optimal  130-159  mg/dL   Borderline  829-562  mg/dL   High  >130     mg/dL   Very High Performed at [facility], [address], [place], [place] [address]     Blood Alcohol level:  Lab Results  Component Value Date   [facility] <15 09/25/2023    Metabolic Disorder Labs: Lab Results  Component Value Date   HGBA1C 4.9 09/28/2023   MPG 93.93 09/28/2023   No results found for: PROLACTIN Lab Results  Component Value Date   CHOL 199 09/28/2023   TRIG 83 09/28/2023   HDL 71 09/28/2023   CHOLHDL 2.8 09/28/2023   VLDL 17 09/28/2023   LDLCALC 111 (H) 09/28/2023      Musculoskeletal: Strength & Muscle Tone: within normal limits Gait & Station: normal Patient leans: N/A  Psychiatric Specialty Exam:  Presentation  General Appearance:  Disheveled  Eye Contact: Fair  Speech: Slow  Speech Volume: Decreased  Handedness:No data recorded  Mood and Affect  Mood: Depressed;  Dysphoric; Irritable  Affect: Tearful; Congruent   Thought Process  Thought Processes: Goal Directed  Descriptions of Associations:Intact  Orientation:Full (Time, Place and Person)  Thought Content:Logical  History of Schizophrenia/Schizoaffective disorder:Yes  Duration of Psychotic Symptoms:Greater than six months  Hallucinations:Hallucinations: None  Ideas of Reference:Paranoia  Suicidal Thoughts:Suicidal Thoughts: No  Homicidal Thoughts:Homicidal Thoughts: No   Sensorium  Memory: Immediate Fair  Judgment: Poor  Insight: Poor   Executive Functions  Concentration: Fair  Attention Span: Fair  Recall: Fair  Fund of Knowledge: Fair  Language: Fair   Psychomotor Activity  Psychomotor Activity: Psychomotor Activity: Normal   Assets  Assets: Communication Skills; Desire for Improvement; Financial Resources/Insurance; Housing; Social Support   Sleep  Sleep: Sleep: Fair    Physical Exam: Physical exam:  General: Well developed, well nourished, facial piercing, African  American female Pupils: Normal at 3mm Respiratory: Breathing is unlabored.  Cardiovascular: No edema.  Language: No anomia, no aphasia Muscle strength and tone-pt moving all extremities.  Gait not assessed as pt remained in bed.  Neuro: Facial muscles are symmetric. Pt without tremor, no evidence of hyperarousal.  Review of Systems  Constitutional: Negative.   HENT: Negative.    Eyes: Negative.   Respiratory: Negative.    Cardiovascular: Negative.   Gastrointestinal: Negative.   Genitourinary: Negative.   Musculoskeletal: Negative.   Skin: Negative.   Neurological: Negative.   Endo/Heme/Allergies: Negative.   Psychiatric/Behavioral:  Positive for depression and substance abuse. The patient is nervous/anxious.    Blood pressure 116/82, pulse 86, temperature 98.7 F (37.1 C), temperature source Oral, resp. rate 20, height 5' 1 (1.549 m), weight 102.4 kg, SpO2 98%,  unknown if currently breastfeeding. Body mass index is 42.66 kg/m.   Treatment Plan Summary: 33 yo F with PPH reported of schizophrenia (?), bipolar disorder, PTSD, insomnia. Patient was agitated in ED and received Geodon  in ED due to inappropriately touching staff and agitaion. Patient reports history of manic episodes but recently presenting with MDE and psychosis. Differential includes schizoaffective disorder, bipolar type and stimulant induced mood disorder. Patient minimizes cocaine abuse. Patient admits to attempting overdose and patient had contacated mother she was suicidal. AT high acute risk for suicide. Will need collateral from mother. Discussed r/b/a of Seroquel for bipolar depression and PTSD and patient is agreeable.    6/15: patient irritable and dysphoric, was crying. Minimizing all symptoms and demanding discharge despite intentional suicide attempt. She is agreeable to further titration. Patient was abusing cocaine and intentionally took 9 sleeping pills. I left voicemail for her mother as she had texted suicidal message to her mother per report.  She has been attending group therapy and I commended her for that however she is abusing drugs and not established with a psychiatrist outpatient with impulsive behaviors. Denies SI or HI today but reported mood of fine is incongruent to affect. Patient reports she has previously been diagnosed as schizoaffective bipolar type but at this time presenting primarily with bipolar depression and PTSD.  Patient appears unstable and at high acute risk for suicide in context of impulsivity, substance abuse and suicide attempt prompting admission.     Treatment Plan Summary: Daily contact with patient to assess and evaluate symptoms and progress in treatment, Medication management, and Plan start Seroquel   Observation Level/Precautions:  15 minute checks  Laboratory:  CBC Chemistry Profile Folic Acid HbAIC HCG UDS UA Vitamin B-12   Psychotherapy:    Medications:  Seroquel  Consultations:    Discharge Concerns:  attempted suicide  Estimated LOS: 4-6 days  Other:      Safety and Monitoring: INVOLUTARILY (second exam completed) admission to inpatient psychiatric unit for safety, stabilization and treatment Daily contact with patient to assess and evaluate symptoms and progress in treatment Patient's case to be discussed in multi-disciplinary team meeting Observation Level : q15 minute checks Vital signs: q12 hours Precautions: suicide, elopement, and assault   2. Psychiatric Problems #Bipolar I disorder, MRE depressed with psychotic features Increase Seroquel 50 mg daily and 150 mg at bedtime (patient states she was already taking BID + 50 mg q6h-PRN for anxiety Stop Risperdal as patient primarily present with depression  #PTSD, chronic Increase Seroquel to 50 mg qdaily to 150 mg at bedtime   #cocaine abuse #unspecified psychosis (rule out stimulant induced psychosis) - appears to be resolving with Seroquel     #  cannabis abuse   3. Medical Management Covid negative CMP: w nl CBC: unremarkable EtOH: <10 UDS: +benzos, cocaine, THC TSH: wnl A1C:  4.9 Lipids: LDL 111     #asthma -cont Albuterol inhaler 1 puff q6h-PRN   Physician Treatment Plan for Primary Diagnosis: Bipolar I disorder, current or most recent episode depressed, with psychotic features (HCC) Long Term Goal(s): Improvement in symptoms so as ready for discharge   Short Term Goals: Ability to identify changes in lifestyle to reduce recurrence of condition will improve, Ability to verbalize feelings will improve, Ability to disclose and discuss suicidal ideas, Ability to demonstrate self-control will improve, Ability to identify and develop effective coping behaviors will improve, Ability to maintain clinical measurements within normal limits will improve, Compliance with prescribed medications will improve, and Ability to identify triggers  associated with substance abuse/mental health issues will improve       I certify that inpatient services furnished can reasonably be expected to improve the patient's condition.   Jolyn Deshmukh, MD 09/28/2023, 11:50 AM

## 2023-09-28 NOTE — Progress Notes (Signed)
 D:  Patient's self inventory sheet, patient sleeps good, sleep medicine helpful.  Fair appetite, normal energy level, good concentration.  Rated depression, hopeless and anxiety #5.  Denied withdrawals.  Denied SI.  Denied Physical problems.  Denied physical pain.  Goal is attend groups, meet with MDs, find out who I can be released to in my family, follow up on missed appointment.  Plans to follow up by requesting to see and speak with MD / SW.  Does have discharge plans. A:  Medications administered per MD orders.  Emotional support and encouragement given patient. R:  Denied SI and HI, contracts for safety.  Denied A/V hallucinations.  Safety maintained with 15 minute checks.

## 2023-09-28 NOTE — Progress Notes (Signed)
 Pt continued to endorse racing thoughts, pt was given PRN medication per [facility] with her anxiety medication.     09/28/23 2015  Psych Admission Type (Psych Patients Only)  Admission Status Involuntary  Psychosocial Assessment  Patient Complaints Anxiety  Eye Contact Fair  Facial Expression Anxious  Affect Appropriate to circumstance  Speech Tangential  Interaction Assertive  Motor Activity Fidgety  Appearance/Hygiene Unremarkable  Behavior Characteristics Cooperative  Mood Anxious;Depressed  Aggressive Behavior  Effect No apparent injury  Thought Process  Coherency Circumstantial;Tangential  Content Blaming self;Blaming others  Delusions Paranoid  Perception Hallucinations  Hallucination Auditory  Judgment Impaired  Confusion WDL  Danger to Self  Current suicidal ideation? Denies  Danger to Others  Danger to Others None reported or observed

## 2023-09-28 NOTE — Progress Notes (Signed)
 Father's phone number [phone]  Mother's phone number [phone]

## 2023-09-28 NOTE — Plan of Care (Signed)
   Problem: Education: Goal: Emotional status will improve Outcome: Progressing Goal: Mental status will improve Outcome: Progressing   Problem: Activity: Goal: Interest or engagement in activities will improve Outcome: Progressing Goal: Sleeping patterns will improve Outcome: Progressing

## 2023-09-28 NOTE — Group Note (Signed)
 LCSW Group Therapy Note  Group Date: 09/28/2023 Start Time: 1100 End Time: 1200   Type of Therapy and Topic:  Group Therapy - Healthy vs Unhealthy Coping Skills  Participation Level:  Active   Description of Group The focus of this group was to determine what unhealthy coping techniques typically are used by group members and what healthy coping techniques would be helpful in coping with various problems. Patients were guided in becoming aware of the differences between healthy and unhealthy coping techniques. Patients were asked to identify 2-3 healthy coping skills they would like to learn to use more effectively.  Therapeutic Goals Patients learned that coping is what human beings do all day long to deal with various situations in their lives Patients defined and discussed healthy vs unhealthy coping techniques Patients identified their preferred coping techniques and identified whether these were healthy or unhealthy Patients determined 2-3 healthy coping skills they would like to become more familiar with and use more often. Patients provided support and ideas to each other   Summary of Patient Progress:  During group, patient expressed openly. Patient proved open to input from peers and feedback from CSW. Patient demonstrated proficient insight into the subject matter, was respectful of peers, and participated throughout the entire session.   Therapeutic Modalities Cognitive Behavioral Therapy Motivational Interviewing  Elma Shands M Laureano Hetzer, LCSWA 09/28/2023  11:48 AM

## 2023-09-28 NOTE — Plan of Care (Signed)
 Nurse discussed anxiety, depression and coping skills with patient.

## 2023-09-28 NOTE — Group Note (Signed)
 Date:  09/28/2023 Time:  9:28 AM  Group Topic/Focus:  Goals Group:   The focus of this group is to help patients establish daily goals to achieve during treatment and discuss how the patient can incorporate goal setting into their daily lives to aide in recovery.    Participation Level:  Active  Participation Quality:  Appropriate  Affect:  Appropriate  Cognitive:  Appropriate  Insight: Good  Engagement in Group:  Engaged  Modes of Intervention:  Orientation  Additional Comments:  Goal is to attend more groups  Violette Grief 09/28/2023, 9:28 AM

## 2023-09-29 ENCOUNTER — Encounter (HOSPITAL_COMMUNITY): Payer: Self-pay | Admitting: Psychiatry

## 2023-09-29 ENCOUNTER — Encounter (HOSPITAL_COMMUNITY): Payer: Self-pay

## 2023-09-29 MED ORDER — LITHIUM CARBONATE ER 300 MG PO TBCR
300.0000 mg | EXTENDED_RELEASE_TABLET | Freq: Two times a day (BID) | ORAL | Status: DC
  Administered 2023-09-29 – 2023-10-04 (×10): 300 mg via ORAL
  Filled 2023-09-29 (×10): qty 1

## 2023-09-29 MED ORDER — LITHIUM CARBONATE ER 300 MG PO TBCR
300.0000 mg | EXTENDED_RELEASE_TABLET | Freq: Once | ORAL | Status: AC
  Administered 2023-09-29: 300 mg via ORAL
  Filled 2023-09-29: qty 1

## 2023-09-29 MED ORDER — QUETIAPINE FUMARATE 200 MG PO TABS
200.0000 mg | ORAL_TABLET | Freq: Every day | ORAL | Status: DC
  Administered 2023-09-29: 200 mg via ORAL
  Filled 2023-09-29: qty 1

## 2023-09-29 MED ORDER — QUETIAPINE FUMARATE 50 MG PO TABS
50.0000 mg | ORAL_TABLET | Freq: Once | ORAL | Status: AC
  Administered 2023-09-29: 50 mg via ORAL
  Filled 2023-09-29: qty 1

## 2023-09-29 MED ORDER — QUETIAPINE FUMARATE 100 MG PO TABS
100.0000 mg | ORAL_TABLET | Freq: Every day | ORAL | Status: DC
  Administered 2023-09-30 – 2023-10-04 (×5): 100 mg via ORAL
  Filled 2023-09-29 (×5): qty 1

## 2023-09-29 MED ORDER — TRAZODONE HCL 50 MG PO TABS
50.0000 mg | ORAL_TABLET | Freq: Every evening | ORAL | Status: DC | PRN
  Administered 2023-09-29 – 2023-10-01 (×2): 50 mg via ORAL
  Filled 2023-09-29 (×2): qty 1

## 2023-09-29 NOTE — Plan of Care (Signed)
   Problem: Activity: Goal: Interest or engagement in activities will improve Outcome: Progressing   Problem: Coping: Goal: Ability to verbalize frustrations and anger appropriately will improve Outcome: Progressing   Problem: Health Behavior/Discharge Planning: Goal: Identification of resources available to assist in meeting health care needs will improve Outcome: Progressing

## 2023-09-29 NOTE — Group Note (Signed)
 Date:  09/29/2023 Time:  1:16 AM  Group Topic/Focus:  Wrap-Up Group:   The focus of this group is to help patients review their daily goal of treatment and discuss progress on daily workbooks.    Participation Level:  Active  Participation Quality:  Monopolizing  Affect:  Appropriate  Cognitive:  Appropriate  Insight: Improving  Engagement in Group:  Monopolizing  Modes of Intervention:  Discussion  Additional Comments:  Pt attended the evening wrap-up group. Tech introduced the staff for the evening, reminded group of the evening schedule and reminded them to ask for anything they need. Pt read and discussed a poem named Autobiography in [employer]. Pt shared her perspective on the poem and how she resonates with the meaning.   Cheryl Baxter 09/29/2023, 1:16 AM

## 2023-09-29 NOTE — Progress Notes (Signed)
  Cheryl Baxter  Patient was given phone number and address for social security office in [place].  Patient was given information for [employer] in [place].   Signed:  Esha Fincher, LCSW-A 09/29/2023  9:53 AM

## 2023-09-29 NOTE — Progress Notes (Addendum)
  Cheryl Baxter   Type of Note: CPS meeting  Received message that patient has an open CPS case (report was made on Friday 6/13 prior to [facility] arrival). Called and spoke with Stana Ear [phone] who is the social worker assigned to her case. Iisha requesting to meet with patient to conduct interview. Meeting scheduled for 6/17 at 10:30AM (~30min) in the conference room.   Patient is aware and agreeable to meeting.  Signed:  Rozalia Dino, LCSW-A 09/29/2023  1:23 PM

## 2023-09-29 NOTE — Progress Notes (Signed)
 [facility] MD Progress Note  09/29/2023 6:02 PM Cheryl Baxter  MRN:  [id number] Subjective:  Per nursing staff:  Slept 6.25 hours, patient has been labile. I observed patient to be slamming her door.   Patient seen for evaluation later during day and was calm and redirectable. Discussed r/b/a of Depakote versus Lithium as an adjunct and patient was agreeable on lithium trial. Discussed monitoring thyroid and her kidney function. Patient admits to difficulty regulating mood at time and with impulsivity. Denies SI or wanting to be dead today. Patient future oriented on seeing her children. WE have both attempted to reach her mother but have been unable to reach.  Patient has minimizes her suicide attempt and substance abuse however states she does not want to be dead and wants to just see her children. Patient requesting assistance with establishing with a psychiatrist and therapist. Denies AVH and not RTIS. Patient not voicing any delusional constructs.    Patient gives me permission to speak with her mother and I called mother and left another  voicemail.     Principal Problem: Bipolar I disorder, current or most recent episode depressed, with psychotic features (HCC) Diagnosis: Principal Problem:   Bipolar I disorder, current or most recent episode depressed, with psychotic features (HCC) Active Problems:   Cocaine use disorder, mild, abuse (HCC)   Chronic post-traumatic stress disorder (PTSD)  Total Time spent with patient: 30 minutes  Past Psychiatric History: see H&P  Past Medical History:  Past Medical History:  Diagnosis Date   Anemia    Herpes genitalia    HIV exposure 12/2015    Past Surgical History:  Procedure Laterality Date   NO PAST SURGERIES     Family History: History reviewed. No pertinent family history. Family Psychiatric  History: see H&P Social History:  Social History   Substance and Sexual Activity  Alcohol Use Yes     Social History   Substance and Sexual  Activity  Drug Use No    Social History   Socioeconomic History   Marital status: Single    Spouse name: Not on file   Number of children: Not on file   Years of education: Not on file   Highest education level: Not on file  Occupational History   Not on file  Tobacco Use   Smoking status: Every Day    Types: Cigars   Smokeless tobacco: Never  Substance and Sexual Activity   Alcohol use: Yes   Drug use: No   Sexual activity: Yes    Birth control/protection: None  Other Topics Concern   Not on file  Social History Narrative   Not on file   Social Drivers of Health   Financial Resource Strain: Not on file  Food Insecurity: No Food Insecurity (09/26/2023)   Hunger Vital Sign    Worried About Running Out of Food in the Last Year: Never true    Ran Out of Food in the Last Year: Never true  Transportation Needs: No Transportation Needs (09/26/2023)   PRAPARE - Administrator, Civil Service (Medical): No    Lack of Transportation (Non-Medical): No  Physical Activity: Not on file  Stress: Not on file  Social Connections: Not on file   Additional Social History:                         Sleep: Fair Estimated Sleeping Duration (Last 24 Hours): 4.75-6.00 hours  Appetite:  Fair  Current Medications:  Current Facility-Administered Medications  Medication Dose Route Frequency Provider Last Rate Last Admin   acetaminophen  (TYLENOL ) tablet 650 mg  650 mg Oral Q6H PRN Motley-Mangrum, Jadeka A, PMHNP       albuterol (VENTOLIN HFA) 108 (90 Base) MCG/ACT inhaler 1 puff  1 puff Inhalation Q4H PRN Montrell Cessna, MD       alum & mag hydroxide-simeth (MAALOX/MYLANTA) 200-200-20 MG/5ML suspension 30 mL  30 mL Oral Q4H PRN Motley-Mangrum, Jadeka A, PMHNP       haloperidol (HALDOL) tablet 5 mg  5 mg Oral TID PRN Motley-Mangrum, Jadeka A, PMHNP       And   diphenhydrAMINE  (BENADRYL ) capsule 50 mg  50 mg Oral TID PRN Motley-Mangrum, Jadeka A, PMHNP       haloperidol  lactate (HALDOL) injection 5 mg  5 mg Intramuscular TID PRN Motley-Mangrum, Jadeka A, PMHNP       And   diphenhydrAMINE  (BENADRYL ) injection 50 mg  50 mg Intramuscular TID PRN Motley-Mangrum, Jadeka A, PMHNP       And   LORazepam (ATIVAN) injection 2 mg  2 mg Intramuscular TID PRN Motley-Mangrum, Jadeka A, PMHNP       haloperidol lactate (HALDOL) injection 10 mg  10 mg Intramuscular TID PRN Motley-Mangrum, Jadeka A, PMHNP       And   diphenhydrAMINE  (BENADRYL ) injection 50 mg  50 mg Intramuscular TID PRN Motley-Mangrum, Jadeka A, PMHNP       And   LORazepam (ATIVAN) injection 2 mg  2 mg Intramuscular TID PRN Motley-Mangrum, Jadeka A, PMHNP       hydrOXYzine  (ATARAX ) tablet 25 mg  25 mg Oral TID PRN Motley-Mangrum, Jadeka A, PMHNP   25 mg at 09/28/23 2100   lithium carbonate (LITHOBID) ER tablet 300 mg  300 mg Oral Q12H Toben Acuna, MD       lithium carbonate (LITHOBID) ER tablet 300 mg  300 mg Oral Once Kandi Brusseau, MD       magnesium hydroxide (MILK OF MAGNESIA) suspension 30 mL  30 mL Oral Daily PRN Motley-Mangrum, Jadeka A, PMHNP       potassium chloride  SA (KLOR-CON  M) CR tablet 20 mEq  20 mEq Oral Daily Motley-Mangrum, Jadeka A, PMHNP   20 mEq at 09/29/23 0856   [START ON 09/30/2023] QUEtiapine (SEROQUEL) tablet 100 mg  100 mg Oral Daily Oshay Stranahan, MD       QUEtiapine (SEROQUEL) tablet 200 mg  200 mg Oral QHS Fisher Hargadon, MD       QUEtiapine (SEROQUEL) tablet 50 mg  50 mg Oral Q6H PRN Lataunya Ruud, MD   50 mg at 09/28/23 2100    Lab Results:  Results for orders placed or performed during the hospital encounter of 09/26/23 (from the past 48 hours)  TSH     Status: None   Collection Time: 09/28/23  6:22 AM  Result Value Ref Range   TSH 1.715 0.350 - 4.500 uIU/mL    Comment: Performed by a 3rd Generation assay with a functional sensitivity of <=0.01 uIU/mL. Performed at [facility], [address], [place], [place] [address]   Hemoglobin A1c     Status:  None   Collection Time: 09/28/23  6:22 AM  Result Value Ref Range   Hgb A1c MFr Bld 4.9 4.8 - 5.6 %    Comment: (NOTE) Diagnosis of Diabetes The following HbA1c ranges recommended by the American Diabetes Association (ADA) may be used as an aid in the diagnosis of diabetes mellitus.  Hemoglobin             Suggested A1C NGSP%              Diagnosis  <5.7                   Non Diabetic  5.7-6.4                Pre-Diabetic  >6.4                   Diabetic  <7.0                   Glycemic control for                       adults with diabetes.     Mean Plasma Glucose 93.93 mg/dL    Comment: Performed at [facility], [address] [place], [place] [address]  Lipid panel     Status: Abnormal   Collection Time: 09/28/23  6:22 AM  Result Value Ref Range   Cholesterol 199 0 - 200 mg/dL   Triglycerides 83 <604 mg/dL   HDL 71 >54 mg/dL   Total CHOL/HDL Ratio 2.8 RATIO   VLDL 17 0 - 40 mg/dL   LDL Cholesterol 098 (H) 0 - 99 mg/dL    Comment:        Total Cholesterol/HDL:CHD Risk Coronary Heart Disease Risk Table                     Men   Women  1/2 Average Risk   3.4   3.3  Average Risk       5.0   4.4  2 X Average Risk   9.6   7.1  3 X Average Risk  23.4   11.0        Use the calculated Patient Ratio above and the CHD Risk Table to determine the patient's CHD Risk.        ATP III CLASSIFICATION (LDL):  <100     mg/dL   Optimal  119-147  mg/dL   Near or Above                    Optimal  130-159  mg/dL   Borderline  829-562  mg/dL   High  >130     mg/dL   Very High Performed at [facility], [address], [place], [place] [address]     Blood Alcohol level:  Lab Results  Component Value Date   [facility] <15 09/25/2023    Metabolic Disorder Labs: Lab Results  Component Value Date   HGBA1C 4.9 09/28/2023   MPG 93.93 09/28/2023   No results found for: PROLACTIN Lab Results  Component Value Date   CHOL 199 09/28/2023   TRIG 83 09/28/2023   HDL 71  09/28/2023   CHOLHDL 2.8 09/28/2023   VLDL 17 09/28/2023   LDLCALC 111 (H) 09/28/2023      Musculoskeletal: Strength & Muscle Tone: within normal limits Gait & Station: normal Patient leans: N/A  Psychiatric Specialty Exam:  Presentation  General Appearance:  Disheveled  Eye Contact: Fair  Speech: Slow  Speech Volume: Decreased  Handedness:No data recorded  Mood and Affect  Mood: Depressed; Dysphoric; Irritable  Affect: Tearful; Congruent   Thought Process  Thought Processes: Goal Directed  Descriptions of Associations:Intact  Orientation:Full (Time, Place and Person)  Thought Content:Logical  History  of Schizophrenia/Schizoaffective disorder:Yes  Duration of Psychotic Symptoms:Greater than six months  Hallucinations:Hallucinations: None  Ideas of Reference:Paranoia  Suicidal Thoughts:Suicidal Thoughts: No  Homicidal Thoughts:Homicidal Thoughts: No   Sensorium  Memory: Immediate Fair  Judgment: Poor  Insight: Poor   Executive Functions  Concentration: Fair  Attention Span: Fair  Recall: Fair  Fund of Knowledge: Fair  Language: Fair   Psychomotor Activity  Psychomotor Activity: Psychomotor Activity: Normal   Assets  Assets: Communication Skills; Desire for Improvement; Financial Resources/Insurance; Housing; Social Support   Sleep  Sleep: Sleep: Fair    Physical Exam: Physical exam:  General: Well developed, well nourished, facial piercing, African American female Pupils: Normal at 3mm Respiratory: Breathing is unlabored.  Cardiovascular: No edema.  Language: No anomia, no aphasia Muscle strength and tone-pt moving all extremities.  Gait not assessed as pt remained in bed.  Neuro: Facial muscles are symmetric. Pt without tremor, no evidence of hyperarousal.  Review of Systems  Constitutional: Negative.   HENT: Negative.    Eyes: Negative.   Respiratory: Negative.    Cardiovascular: Negative.    Gastrointestinal: Negative.   Genitourinary: Negative.   Musculoskeletal: Negative.   Skin: Negative.   Neurological: Negative.   Endo/Heme/Allergies: Negative.   Psychiatric/Behavioral:  Positive for depression and substance abuse. The patient is nervous/anxious.    Blood pressure 114/79, pulse 93, temperature 98.4 F (36.9 C), temperature source Oral, resp. rate 16, height 5' 1 (1.549 m), weight 102.4 kg, SpO2 100%, unknown if currently breastfeeding. Body mass index is 42.66 kg/m.   Treatment Plan Summary: 33 yo F with PPH reported of schizophrenia (?), bipolar disorder, PTSD, insomnia. Patient was agitated in ED and received Geodon  in ED due to inappropriately touching staff and agitaion. Patient reports history of manic episodes but recently presenting with MDE and psychosis. Differential includes schizoaffective disorder, bipolar type and stimulant induced mood disorder. Patient minimizes cocaine abuse. Patient admits to attempting overdose and patient had contacated mother she was suicidal. AT high acute risk for suicide. Will need collateral from mother. Discussed r/b/a of Seroquel for bipolar depression and PTSD and patient is agreeable.    6/15: patient irritable and dysphoric, was crying. Minimizing all symptoms and demanding discharge despite intentional suicide attempt. She is agreeable to further titration. Patient was abusing cocaine and intentionally took 9 sleeping pills. I left voicemail for her mother as she had texted suicidal message to her mother per report.  She has been attending group therapy and I commended her for that however she is abusing drugs and not established with a psychiatrist outpatient with impulsive behaviors. Denies SI or HI today but reported mood of fine is incongruent to affect. Patient reports she has previously been diagnosed as schizoaffective bipolar type but at this time presenting primarily with bipolar depression and PTSD.  Patient appears  unstable and at high acute risk for suicide in context of impulsivity, substance abuse and suicide attempt prompting admission.    6/16: patient denies SI but has had some mood lability and irritability. Discussed r/b/a of starting lithium and she is agreeable. Presents with bipolar depression, does not appear manic or psychotic at this time. Tolerating Seroquel and noting benefit. Requesting additional trazodone at night for insomnia.    Treatment Plan Summary: Daily contact with patient to assess and evaluate symptoms and progress in treatment, Medication management, and Plan start Seroquel   Observation Level/Precautions:  15 minute checks  Laboratory:  CBC Chemistry Profile Folic Acid HbAIC HCG UDS UA Vitamin B-12  Psychotherapy:    Medications:  Seroquel  Consultations:    Discharge Concerns:  attempted suicide, mood lability, collateral from mother  Estimated LOS: 2-4 days  Other:      Safety and Monitoring: INVOLUTARILY (second exam completed) admission to inpatient psychiatric unit for safety, stabilization and treatment Daily contact with patient to assess and evaluate symptoms and progress in treatment Patient's case to be discussed in multi-disciplinary team meeting Observation Level : q15 minute checks Vital signs: q12 hours Precautions: suicide, elopement, and assault   2. Psychiatric Problems #Bipolar I disorder, MRE depressed with psychotic features Increase Seroquel 100 mg daily and 20 mg at bedtime + 50 mg q6h-PRN for anxiety Stopped Risperdal as patient primarily present with depression  #PTSD, chronic Increase Seroquel 100 mg daily and 20 mg at bedtime + 50 mg q6h-PRN for anxiety   #cocaine abuse #unspecified psychosis (rule out stimulant induced psychosis) Resolved with Seroquel  #cannabis abuse   3. Medical Management Covid negative CMP: w nl CBC: unremarkable EtOH: <10 UDS: +benzos, cocaine, THC TSH: wnl A1C:  4.9 Lipids: LDL 111      #asthma -cont Albuterol inhaler 1 puff q6h-PRN   Physician Treatment Plan for Primary Diagnosis: Bipolar I disorder, current or most recent episode depressed, with psychotic features (HCC) Long Term Goal(s): Improvement in symptoms so as ready for discharge   Short Term Goals: Ability to identify changes in lifestyle to reduce recurrence of condition will improve, Ability to verbalize feelings will improve, Ability to disclose and discuss suicidal ideas, Ability to demonstrate self-control will improve, Ability to identify and develop effective coping behaviors will improve, Ability to maintain clinical measurements within normal limits will improve, Compliance with prescribed medications will improve, and Ability to identify triggers associated with substance abuse/mental health issues will improve       I certify that inpatient services furnished can reasonably be expected to improve the patient's condition.   Shmuel Girgis, MD 09/29/2023, 6:02 PM

## 2023-09-29 NOTE — Progress Notes (Signed)
   09/29/23 0800  Psych Admission Type (Psych Patients Only)  Admission Status Involuntary  Psychosocial Assessment  Patient Complaints Anxiety  Eye Contact Fair  Facial Expression Anxious  Affect Appropriate to circumstance  Speech Tangential  Interaction Assertive  Motor Activity Fidgety  Appearance/Hygiene Unremarkable  Behavior Characteristics Cooperative;Appropriate to situation  Mood Anxious;Depressed  Aggressive Behavior  Effect No apparent injury  Thought Process  Coherency Circumstantial;Tangential  Content Blaming self;Blaming others  Delusions Paranoid  Perception Hallucinations  Hallucination Auditory  Judgment Impaired  Confusion WDL  Danger to Self  Current suicidal ideation? Denies  Agreement Not to Harm Self Yes  Description of Agreement Contracts for safety  Danger to Others  Danger to Others None reported or observed

## 2023-09-29 NOTE — [facility] Group Notes (Addendum)
 Spirituality Group   Group Goal: Support / Education around grief and loss    Group Description: Following introductions and group rules, group members engaged in facilitated group dialog and support around topic of loss, with particular support around experiences of loss in their lives. Group members identified types of loss (relationships / self / things) as well as patterns, circumstances, and changes that precipitate loss. Reflection invited on thoughts / feelings around loss, normalized grief responses, and recognized variety in grief experience. Group noted Worden's four tasks of grief in discussion. Group drew on Adlerian / Rogerian, narrative, MI, with Yalom's group therapy as a primary framework.   Observations: Cheryl Baxter was reserved but still passively engaged in the group discussion.  Cheryl Baxter L. [name], M.Div [phone]

## 2023-09-29 NOTE — [facility] Group Notes (Signed)
 Adult Psychoeducational Group Note  Date:  09/29/2023 Time:  9:05 PM  Group Topic/Focus:  Wrap-Up Group:   The focus of this group is to help patients review their daily goal of treatment and discuss progress on daily workbooks.  Participation Level:  Active  Participation Quality:  Appropriate  Affect:  Appropriate  Cognitive:  Appropriate  Insight: Good  Engagement in Group:  Engaged  Modes of Intervention:  Discussion  Additional Comments:  Patient attended and participated in the Wrap-up group.  Becki Bouton 09/29/2023, 9:05 PM

## 2023-09-29 NOTE — Group Note (Signed)
 Recreation Therapy Group Note   Group Topic:Stress Management  Group Date: 09/29/2023 Start Time: 9604 End Time: 1003 Facilitators: Moussa Wiegand-McCall, LRT,CTRS Location: [address] Dayroom   Group Topic: Stress Management   Goal Area(s) Addresses:  Patient will actively participate in stress management techniques presented during session.  Patient will successfully identify benefit of practicing stress management post d/c.   Behavioral Response:   Intervention: Relaxation exercise with ambient sound and script   Activity: Guided Imagery. LRT provided education, instruction, and demonstration on practice of visualization via guided imagery. Patient was asked to participate in the technique introduced during session. LRT debriefed including topics of mindfulness, stress management and specific scenarios each patient could use these techniques. Patients were given suggestions of ways to access scripts post d/c and encouraged to explore Youtube and other apps available on smartphones, tablets, and computers.  Education:  Stress Management, Discharge Planning.   Education Outcome: Acknowledges education   Affect/Mood: N/A   Participation Level: Did not attend    Clinical Observations/Individualized Feedback:     Plan: Continue to engage patient in RT group sessions 2-3x/week.   Hiromi Knodel-McCall, LRT,CTRS 09/29/2023 1:12 PM

## 2023-09-29 NOTE — BH IP Treatment Plan (Signed)
 Interdisciplinary Treatment and Diagnostic Plan Update  09/29/2023 Time of Session: 1453 Cheryl Baxter MRN: [id number]  Principal Diagnosis: Bipolar I disorder, current or most recent episode depressed, with psychotic features (HCC)  Secondary Diagnoses: Principal Problem:   Bipolar I disorder, current or most recent episode depressed, with psychotic features (HCC) Active Problems:   Cocaine use disorder, mild, abuse (HCC)   Chronic post-traumatic stress disorder (PTSD)   Current Medications:  Current Facility-Administered Medications  Medication Dose Route Frequency Provider Last Rate Last Admin   acetaminophen  (TYLENOL ) tablet 650 mg  650 mg Oral Q6H PRN Motley-Mangrum, Jadeka A, PMHNP       albuterol (VENTOLIN HFA) 108 (90 Base) MCG/ACT inhaler 1 puff  1 puff Inhalation Q4H PRN Zouev, Dmitri, MD       alum & mag hydroxide-simeth (MAALOX/MYLANTA) 200-200-20 MG/5ML suspension 30 mL  30 mL Oral Q4H PRN Motley-Mangrum, Jadeka A, PMHNP       haloperidol (HALDOL) tablet 5 mg  5 mg Oral TID PRN Motley-Mangrum, Jadeka A, PMHNP       And   diphenhydrAMINE  (BENADRYL ) capsule 50 mg  50 mg Oral TID PRN Motley-Mangrum, Jadeka A, PMHNP       haloperidol lactate (HALDOL) injection 5 mg  5 mg Intramuscular TID PRN Motley-Mangrum, Jadeka A, PMHNP       And   diphenhydrAMINE  (BENADRYL ) injection 50 mg  50 mg Intramuscular TID PRN Motley-Mangrum, Jadeka A, PMHNP       And   LORazepam (ATIVAN) injection 2 mg  2 mg Intramuscular TID PRN Motley-Mangrum, Jadeka A, PMHNP       haloperidol lactate (HALDOL) injection 10 mg  10 mg Intramuscular TID PRN Motley-Mangrum, Jadeka A, PMHNP       And   diphenhydrAMINE  (BENADRYL ) injection 50 mg  50 mg Intramuscular TID PRN Motley-Mangrum, Jadeka A, PMHNP       And   LORazepam (ATIVAN) injection 2 mg  2 mg Intramuscular TID PRN Motley-Mangrum, Jadeka A, PMHNP       hydrOXYzine  (ATARAX ) tablet 25 mg  25 mg Oral TID PRN Motley-Mangrum, Jadeka A, PMHNP   25 mg at  09/28/23 2100   magnesium hydroxide (MILK OF MAGNESIA) suspension 30 mL  30 mL Oral Daily PRN Motley-Mangrum, Jadeka A, PMHNP       potassium chloride  SA (KLOR-CON  M) CR tablet 20 mEq  20 mEq Oral Daily Motley-Mangrum, Jadeka A, PMHNP   20 mEq at 09/29/23 0856   [START ON 09/30/2023] QUEtiapine (SEROQUEL) tablet 100 mg  100 mg Oral Daily Zouev, Dmitri, MD       QUEtiapine (SEROQUEL) tablet 200 mg  200 mg Oral QHS Zouev, Dmitri, MD       QUEtiapine (SEROQUEL) tablet 50 mg  50 mg Oral Q6H PRN Zouev, Dmitri, MD   50 mg at 09/28/23 2100   PTA Medications: Medications Prior to Admission  Medication Sig Dispense Refill Last Dose/Taking   calcium-vitamin D (OSCAL WITH D) 500-5 MG-MCG tablet Take 1 tablet by mouth daily with breakfast.      QUEtiapine (SEROQUEL) 50 MG tablet Take 50 mg by mouth at bedtime.      risperiDONE (RISPERDAL) 2 MG tablet Take 2 mg by mouth at bedtime.       Patient Stressors: Medication change or noncompliance   Substance abuse   Traumatic event    Patient Strengths: Capable of independent living  Communication skills  Physical Health  Supportive family/friends   Treatment Modalities: Medication Management, Group therapy, Case management,  1 to 1 session with clinician, Psychoeducation, Recreational therapy.   Physician Treatment Plan for Primary Diagnosis: Bipolar I disorder, current or most recent episode depressed, with psychotic features (HCC) Long Term Goal(s): Improvement in symptoms so as ready for discharge   Short Term Goals: Ability to identify changes in lifestyle to reduce recurrence of condition will improve Ability to verbalize feelings will improve Ability to disclose and discuss suicidal ideas Ability to demonstrate self-control will improve Ability to identify and develop effective coping behaviors will improve Ability to maintain clinical measurements within normal limits will improve Compliance with prescribed medications will improve Ability  to identify triggers associated with substance abuse/mental health issues will improve  Medication Management: Evaluate patient's response, side effects, and tolerance of medication regimen.  Therapeutic Interventions: 1 to 1 sessions, Unit Group sessions and Medication administration.  Evaluation of Outcomes: Not Progressing  Physician Treatment Plan for Secondary Diagnosis: Principal Problem:   Bipolar I disorder, current or most recent episode depressed, with psychotic features (HCC) Active Problems:   Cocaine use disorder, mild, abuse (HCC)   Chronic post-traumatic stress disorder (PTSD)  Long Term Goal(s): Improvement in symptoms so as ready for discharge   Short Term Goals: Ability to identify changes in lifestyle to reduce recurrence of condition will improve Ability to verbalize feelings will improve Ability to disclose and discuss suicidal ideas Ability to demonstrate self-control will improve Ability to identify and develop effective coping behaviors will improve Ability to maintain clinical measurements within normal limits will improve Compliance with prescribed medications will improve Ability to identify triggers associated with substance abuse/mental health issues will improve     Medication Management: Evaluate patient's response, side effects, and tolerance of medication regimen.  Therapeutic Interventions: 1 to 1 sessions, Unit Group sessions and Medication administration.  Evaluation of Outcomes: Not Progressing   RN Treatment Plan for Primary Diagnosis: Bipolar I disorder, current or most recent episode depressed, with psychotic features (HCC) Long Term Goal(s): Knowledge of disease and therapeutic regimen to maintain health will improve  Short Term Goals: Ability to remain free from injury will improve, Ability to verbalize frustration and anger appropriately will improve, Ability to demonstrate self-control, Ability to participate in decision making will  improve, Ability to verbalize feelings will improve, Ability to disclose and discuss suicidal ideas, Ability to identify and develop effective coping behaviors will improve, and Compliance with prescribed medications will improve  Medication Management: RN will administer medications as ordered by provider, will assess and evaluate patient's response and provide education to patient for prescribed medication. RN will report any adverse and/or side effects to prescribing provider.  Therapeutic Interventions: 1 on 1 counseling sessions, Psychoeducation, Medication administration, Evaluate responses to treatment, Monitor vital signs and CBGs as ordered, Perform/monitor CIWA, COWS, AIMS and Fall Risk screenings as ordered, Perform wound care treatments as ordered.  Evaluation of Outcomes: Not Progressing   LCSW Treatment Plan for Primary Diagnosis: Bipolar I disorder, current or most recent episode depressed, with psychotic features (HCC) Long Term Goal(s): Safe transition to appropriate next level of care at discharge, Engage patient in therapeutic group addressing interpersonal concerns.  Short Term Goals: Engage patient in aftercare planning with referrals and resources, Increase social support, Increase ability to appropriately verbalize feelings, Increase emotional regulation, Facilitate acceptance of mental health diagnosis and concerns, Facilitate patient progression through stages of change regarding substance use diagnoses and concerns, Identify triggers associated with mental health/substance abuse issues, and Increase skills for wellness and recovery  Therapeutic Interventions: Assess for all  discharge needs, 1 to 1 time with Child psychotherapist, Explore available resources and support systems, Assess for adequacy in community support network, Educate family and significant other(s) on suicide prevention, Complete Psychosocial Assessment, Interpersonal group therapy.  Evaluation of Outcomes: Not  Progressing   Progress in Treatment: Attending groups: Yes. Participating in groups: Yes. Taking medication as prescribed: Yes. Toleration medication: Yes. Family/Significant other contact made: No, will contact:  Dallie Duel (mother) Patient understands diagnosis: Yes. Discussing patient identified problems/goals with staff: Yes. Medical problems stabilized or resolved: Yes. Denies suicidal/homicidal ideation: Yes. Issues/concerns per patient self-inventory: No. Other: n/a  New problem(s) identified: No, Describe:  None  New Short Term/Long Term Goal(s): medication stabilization, elimination of SI thoughts, development of comprehensive mental wellness plan.   Patient Goals:  Ensure I have follow-up with a psychiatrist & therapist  Discharge Plan or Barriers: Patient recently admitted. CSW will continue to follow and assess for appropriate referrals and possible discharge planning.    Reason for Continuation of Hospitalization: Anxiety Medication stabilization Other; describe Mood stabilization, discharge planning  Estimated Length of Stay: 3-5 DAYS  Last 3 [place] Suicide Severity Risk Score: Flowsheet Row Ad[facility] (Current) [facility] from 09/25/2023 in [facility] Department [facility] ED from [facility]/07/2023 in [facility]  C-SSRS RISK CATEGORY High Risk High Risk No Risk    Last [facility] 2/9 Scores:     No data to display          Scribe for Treatment Team: Evaline Waltman N Catalyna Reilly, LCSW 09/29/2023 1:23 PM

## 2023-09-29 NOTE — Group Note (Signed)
 Date:  09/29/2023 Time:  10:25 AM  Group Topic/Focus:  Emotional Education:   The focus of this group is to discuss what feelings/emotions are, and how they are experienced. Goals Group:   The focus of this group is to help patients establish daily goals to achieve during treatment and discuss how the patient can incorporate goal setting into their daily lives to aide in recovery. Orientation:   The focus of this group is to educate the patient on the purpose and policies of crisis stabilization and provide a format to answer questions about their admission.  The group details unit policies and expectations of patients while admitted.    Participation Level:  Did Not Attend  Almarie Arias 09/29/2023, 10:25 AM

## 2023-09-30 MED ORDER — QUETIAPINE FUMARATE 300 MG PO TABS
300.0000 mg | ORAL_TABLET | Freq: Every day | ORAL | Status: DC
  Administered 2023-09-30 – 2023-10-03 (×4): 300 mg via ORAL
  Filled 2023-09-30 (×4): qty 1

## 2023-09-30 NOTE — Group Note (Signed)
 Recreation Therapy Group Note   Group Topic:Animal Assisted Therapy   Group Date: 09/30/2023 Start Time: 1610 End Time: 1035 Facilitators: Cheryl Baxter, LRT,CTRS Location: 300 Hall Dayroom   Animal-Assisted Activity (AAA) Program Checklist/Progress Notes Patient Eligibility Criteria Checklist & Daily Group note for Rec Tx Intervention  AAA/T Program Assumption of Risk Form signed by Patient/ or Parent Legal Guardian Yes  Patient is free of allergies or severe asthma Yes  Patient reports no fear of animals Yes  Patient reports no history of cruelty to animals Yes  Patient understands his/her participation is voluntary Yes  Patient washes hands before animal contact Yes  Patient washes hands after animal contact Yes  Behavioral Response: Appropriate    Education: Hand Washing, Appropriate Animal Interaction   Education Outcome: Acknowledges education.    Affect/Mood: Appropriate   Participation Level: Engaged   Participation Quality: Independent   Behavior: Appropriate   Speech/Thought Process: Focused   Insight: Good   Judgement: Good   Modes of Intervention: Teaching laboratory technician   Patient Response to Interventions:  Engaged   Education Outcome:  In group clarification offered    Clinical Observations/Individualized Feedback: Patient attended session and interacted appropriately with therapy dog and peers. Patient asked appropriate questions about therapy dog and his training. Patient shared stories about their pets at home with group.    Plan: Continue to engage patient in RT group sessions 2-3x/week.   Cheryl Baxter, LRT,CTRS  09/30/2023 12:35 PM

## 2023-09-30 NOTE — [facility] Suicide Risk Assessment (Signed)
 [facility] INPATIENT:  Family/Significant Other Suicide Prevention Education  Suicide Prevention Education:  Education Completed; Gabrielly Mccrystal (mother) [phone],  (name of family member/significant other) has been identified by the patient as the family member/significant other with whom the patient will be residing, and identified as the person(s) who will aid the patient in the event of a mental health crisis (suicidal ideations/suicide attempt).  With written consent from the patient, the family member/significant other has been provided the following suicide prevention education, prior to the and/or following the discharge of the patient.  Mother will pick pt up at discharge. Mother is caring for pt's children for the time-being.   The suicide prevention education provided includes the following: Suicide risk factors Suicide prevention and interventions National Suicide Hotline telephone number [facility] assessment telephone number [facility] Emergency Assistance 911 [facility] and/or Residential Mobile Crisis Unit telephone number  Request made of family/significant other to: Remove weapons (e.g., guns, rifles, knives), all items previously/currently identified as safety concern.   Remove drugs/medications (over-the-counter, prescriptions, illicit drugs), all items previously/currently identified as a safety concern.  The family member/significant other verbalizes understanding of the suicide prevention education information provided.  The family member/significant other agrees to remove the items of safety concern listed above.  Vonzell Guerin 09/30/2023, 3:51 PM

## 2023-09-30 NOTE — Plan of Care (Signed)
  Problem: Safety: Goal: Ability to remain free from injury will improve Outcome: Progressing   Problem: Coping: Goal: Coping ability will improve Outcome: Progressing   Problem: Self-Concept: Goal: Will verbalize positive feelings about self Outcome: Progressing

## 2023-09-30 NOTE — Plan of Care (Signed)
   Problem: Education: Goal: Knowledge of [facility] Education information/materials will improve Outcome: Progressing Goal: Emotional status will improve Outcome: Progressing Goal: Mental status will improve Outcome: Progressing Goal: Verbalization of understanding the information provided will improve Outcome: Progressing   Problem: Activity: Goal: Interest or engagement in activities will improve Outcome: Progressing   Problem: Coping: Goal: Ability to verbalize frustrations and anger appropriately will improve Outcome: Progressing

## 2023-09-30 NOTE — Progress Notes (Signed)
 [facility] MD Progress Note  09/30/2023 7:06 PM Cheryl Baxter  MRN:  [id number] Subjective:  Per nursing staff:  Slept 6.25 hours, patient has been labile and irritable.  Patient seen for evaluation and remains irritable and argumentative. Denies AVH and is not responding to internal stimuli. Patient tolerating lithium and denies side effects. Still having difficulties with sleep and we discussed increased Seroquel dosing.   Patient admits to difficulty regulating mood at time and with impulsivity, however minimizes all symptoms stating I can be discharged.  Denies SI or HI. Patient future oriented on seeing her children.  Patient not voicing any delusional constructs.    Patient gives me permission to speak with her mother and I called mother.  I was able to reach patient's mother for collateral and she notes that she was in the house when patient went to the bathroom and overdosed. States patient's children are with her and they are safe and she wants patient to focus on her mental health. States she spoke with patient last night and she does not sound at her baseline and is irritable and angry. States patient agreed to do rehab with her or longer term treatment, however patient adamantly refuses inpatient rehab with me. Mother is afraid for patient's safety given recent suicide attempt and irritable mood. States patient prior to admission thought someone was in the woods when noone was there.    Principal Problem: Bipolar I disorder, current or most recent episode depressed, with psychotic features (HCC) Diagnosis: Principal Problem:   Bipolar I disorder, current or most recent episode depressed, with psychotic features (HCC) Active Problems:   Cocaine use disorder, mild, abuse (HCC)   Chronic post-traumatic stress disorder (PTSD)  Total Time spent with patient: 30 minutes  Past Psychiatric History: see H&P  Past Medical History:  Past Medical History:  Diagnosis Date   Anemia    Herpes  genitalia    HIV exposure 12/2015    Past Surgical History:  Procedure Laterality Date   NO PAST SURGERIES     Family History: History reviewed. No pertinent family history. Family Psychiatric  History: see H&P Social History:  Social History   Substance and Sexual Activity  Alcohol Use Yes     Social History   Substance and Sexual Activity  Drug Use No    Social History   Socioeconomic History   Marital status: Single    Spouse name: Not on file   Number of children: Not on file   Years of education: Not on file   Highest education level: Not on file  Occupational History   Not on file  Tobacco Use   Smoking status: Every Day    Types: Cigars   Smokeless tobacco: Never  Substance and Sexual Activity   Alcohol use: Yes   Drug use: No   Sexual activity: Yes    Birth control/protection: None  Other Topics Concern   Not on file  Social History Narrative   Not on file   Social Drivers of Health   Financial Resource Strain: Not on file  Food Insecurity: No Food Insecurity (09/26/2023)   Hunger Vital Sign    Worried About Running Out of Food in the Last Year: Never true    Ran Out of Food in the Last Year: Never true  Transportation Needs: No Transportation Needs (09/26/2023)   PRAPARE - Administrator, Civil Service (Medical): No    Lack of Transportation (Non-Medical): No  Physical Activity: Not on file  Stress: Not on file  Social Connections: Not on file   Additional Social History:                         Sleep: Fair Estimated Sleeping Duration (Last 24 Hours): 8.00-8.25 hours  Appetite:  Fair  Current Medications: Current Facility-Administered Medications  Medication Dose Route Frequency Provider Last Rate Last Admin   acetaminophen  (TYLENOL ) tablet 650 mg  650 mg Oral Q6H PRN Motley-Mangrum, Jadeka A, PMHNP       albuterol (VENTOLIN HFA) 108 (90 Base) MCG/ACT inhaler 1 puff  1 puff Inhalation Q4H PRN Kazoua Gossen, MD        alum & mag hydroxide-simeth (MAALOX/MYLANTA) 200-200-20 MG/5ML suspension 30 mL  30 mL Oral Q4H PRN Motley-Mangrum, Jadeka A, PMHNP       haloperidol (HALDOL) tablet 5 mg  5 mg Oral TID PRN Motley-Mangrum, Jadeka A, PMHNP       And   diphenhydrAMINE  (BENADRYL ) capsule 50 mg  50 mg Oral TID PRN Motley-Mangrum, Jadeka A, PMHNP       haloperidol lactate (HALDOL) injection 5 mg  5 mg Intramuscular TID PRN Motley-Mangrum, Jadeka A, PMHNP       And   diphenhydrAMINE  (BENADRYL ) injection 50 mg  50 mg Intramuscular TID PRN Motley-Mangrum, Jadeka A, PMHNP       And   LORazepam (ATIVAN) injection 2 mg  2 mg Intramuscular TID PRN Motley-Mangrum, Jadeka A, PMHNP       haloperidol lactate (HALDOL) injection 10 mg  10 mg Intramuscular TID PRN Motley-Mangrum, Jadeka A, PMHNP       And   diphenhydrAMINE  (BENADRYL ) injection 50 mg  50 mg Intramuscular TID PRN Motley-Mangrum, Jadeka A, PMHNP       And   LORazepam (ATIVAN) injection 2 mg  2 mg Intramuscular TID PRN Motley-Mangrum, Jadeka A, PMHNP       hydrOXYzine  (ATARAX ) tablet 25 mg  25 mg Oral TID PRN Motley-Mangrum, Jadeka A, PMHNP   25 mg at 09/29/23 2112   lithium carbonate (LITHOBID) ER tablet 300 mg  300 mg Oral Q12H Jaquanna Ballentine, MD   300 mg at 09/30/23 0902   magnesium hydroxide (MILK OF MAGNESIA) suspension 30 mL  30 mL Oral Daily PRN Motley-Mangrum, Jadeka A, PMHNP       QUEtiapine (SEROQUEL) tablet 100 mg  100 mg Oral Daily Micaylah Bertucci, MD   100 mg at 09/30/23 0902   QUEtiapine (SEROQUEL) tablet 300 mg  300 mg Oral QHS Kiron Osmun, MD       QUEtiapine (SEROQUEL) tablet 50 mg  50 mg Oral Q6H PRN Jakki Doughty, MD   50 mg at 09/28/23 2100   traZODone (DESYREL) tablet 50 mg  50 mg Oral QHS PRN Shariah Assad, MD   50 mg at 09/29/23 2112    Lab Results:  No results found for this or any previous visit (from the past 48 hours).   Blood Alcohol level:  Lab Results  Component Value Date   [facility] <15 09/25/2023    Metabolic Disorder Labs: Lab  Results  Component Value Date   HGBA1C 4.9 09/28/2023   MPG 93.93 09/28/2023   No results found for: PROLACTIN Lab Results  Component Value Date   CHOL 199 09/28/2023   TRIG 83 09/28/2023   HDL 71 09/28/2023   CHOLHDL 2.8 09/28/2023   VLDL 17 09/28/2023   LDLCALC 111 (H) 09/28/2023      Musculoskeletal: Strength & Muscle Tone:  within normal limits Gait & Station: normal Patient leans: N/A  Psychiatric Specialty Exam:  Presentation  General Appearance:  Disheveled  Eye Contact: Fair  Speech: Slow  Speech Volume: Decreased  Handedness:No data recorded  Mood and Affect  Mood: Depressed; Dysphoric; Irritable  Affect: Tearful; Congruent   Thought Process  Thought Processes: Goal Directed  Descriptions of Associations:Intact  Orientation:Full (Time, Place and Person)  Thought Content:Logical  History of Schizophrenia/Schizoaffective disorder:Yes  Duration of Psychotic Symptoms:Greater than six months  Hallucinations:No data recorded  Ideas of Reference:Paranoia  Suicidal Thoughts:No data recorded  Homicidal Thoughts:No data recorded   Sensorium  Memory: Immediate Fair  Judgment: Poor  Insight: Poor   Executive Functions  Concentration: Fair  Attention Span: Fair  Recall: [employer] of Knowledge: Fair  Language: Fair   Psychomotor Activity  Psychomotor Activity: No data recorded   Assets  Assets: Communication Skills; Desire for Improvement; Financial Resources/Insurance; Housing; Social Support   Sleep  Sleep: No data recorded    Physical Exam: Physical exam:  General: Well developed, well nourished, facial piercing, African American female Pupils: Normal at 3mm Respiratory: Breathing is unlabored.  Cardiovascular: No edema.  Language: No anomia, no aphasia Muscle strength and tone-pt moving all extremities.  Gait not assessed as pt remained in bed.  Neuro: Facial muscles are symmetric. Pt without  tremor, no evidence of hyperarousal.  Review of Systems  Constitutional: Negative.   HENT: Negative.    Eyes: Negative.   Respiratory: Negative.    Cardiovascular: Negative.   Gastrointestinal: Negative.   Genitourinary: Negative.   Musculoskeletal: Negative.   Skin: Negative.   Neurological: Negative.   Endo/Heme/Allergies: Negative.   Psychiatric/Behavioral:  Positive for depression and substance abuse. The patient is nervous/anxious.    Blood pressure 115/77, pulse 77, temperature 98.4 F (36.9 C), temperature source Oral, resp. rate 16, height 5' 1 (1.549 m), weight 102.4 kg, SpO2 100%, unknown if currently breastfeeding. Body mass index is 42.66 kg/m.   Treatment Plan Summary: 33 yo F with PPH reported of schizophrenia (?), bipolar disorder, PTSD, insomnia. Patient was agitated in ED and received Geodon  in ED due to inappropriately touching staff and agitaion. Patient reports history of manic episodes but recently presenting with MDE and psychosis. Differential includes schizoaffective disorder, bipolar type and stimulant induced mood disorder. Patient minimizes cocaine abuse. Patient admits to attempting overdose and patient had contacated mother she was suicidal. AT high acute risk for suicide. Will need collateral from mother. Discussed r/b/a of Seroquel for bipolar depression and PTSD and patient is agreeable.    6/15: patient irritable and dysphoric, was crying. Minimizing all symptoms and demanding discharge despite intentional suicide attempt. She is agreeable to further titration. Patient was abusing cocaine and intentionally took 9 sleeping pills. I left voicemail for her mother as she had texted suicidal message to her mother per report.  She has been attending group therapy and I commended her for that however she is abusing drugs and not established with a psychiatrist outpatient with impulsive behaviors. Denies SI or HI today but reported mood of fine is incongruent to  affect. Patient reports she has previously been diagnosed as schizoaffective bipolar type but at this time presenting primarily with bipolar depression and PTSD.  Patient appears unstable and at high acute risk for suicide in context of impulsivity, substance abuse and suicide attempt prompting admission.    6/16: patient denies SI but has had some mood lability and irritability. Discussed r/b/a of starting lithium and she  is agreeable. Presents with bipolar depression, does not appear manic or psychotic at this time. Tolerating Seroquel and noting benefit. Requesting additional trazodone at night for insomnia.   6/17: patient with irritability, mood lability, insomnia. Has not been physically aggressive or agitated however had intentional overdose in front of her mother. Mother is concerned for her safety stating she presents irritable with her as well and is not at baseline. Will maintain IVC, discussed increasing Seroquel dosing.   Treatment Plan Summary: Daily contact with patient to assess and evaluate symptoms and progress in treatment, Medication management, and Plan start Seroquel   Observation Level/Precautions:  15 minute checks  Laboratory:  CBC Chemistry Profile Folic Acid HbAIC HCG UDS UA Vitamin B-12  Psychotherapy:    Medications:  Seroquel  Consultations:    Discharge Concerns:  attempted suicide, mood lability, collateral from mother  Estimated LOS: 2-4 days  Other:      Safety and Monitoring: INVOLUTARILY (second exam completed) admission to inpatient psychiatric unit for safety, stabilization and treatment Daily contact with patient to assess and evaluate symptoms and progress in treatment Patient's case to be discussed in multi-disciplinary team meeting Observation Level : q15 minute checks Vital signs: q12 hours Precautions: suicide, elopement, and assault   2. Psychiatric Problems #Bipolar I disorder, MRE depressed with psychotic features Increase Seroquel 100  mg daily and 300 mg at bedtime + 50 mg q6h-PRN for anxiety Stopped Risperdal as patient primarily present with depression  #PTSD, chronic Increase Seroquel 100 mg daily and 300 mg at bedtime + 50 mg q6h-PRN for anxiety   #cocaine abuse #unspecified psychosis (rule out stimulant induced psychosis) Resolved with Seroquel  #cannabis abuse   3. Medical Management Covid negative CMP: w nl CBC: unremarkable EtOH: <10 UDS: +benzos, cocaine, THC TSH: wnl A1C:  4.9 Lipids: LDL 111     #asthma -cont Albuterol inhaler 1 puff q6h-PRN   Physician Treatment Plan for Primary Diagnosis: Bipolar I disorder, current or most recent episode depressed, with psychotic features (HCC) Long Term Goal(s): Improvement in symptoms so as ready for discharge   Short Term Goals: Ability to identify changes in lifestyle to reduce recurrence of condition will improve, Ability to verbalize feelings will improve, Ability to disclose and discuss suicidal ideas, Ability to demonstrate self-control will improve, Ability to identify and develop effective coping behaviors will improve, Ability to maintain clinical measurements within normal limits will improve, Compliance with prescribed medications will improve, and Ability to identify triggers associated with substance abuse/mental health issues will improve       I certify that inpatient services furnished can reasonably be expected to improve the patient's condition.   Shron Ozer, MD 09/30/2023, 7:06 PM

## 2023-09-30 NOTE — [facility] Group Notes (Signed)
 [facility] Group Notes:  (Nursing/MHT/Case Management/Adjunct)  Date:  09/30/2023  Time:  9:17 PM  Type of Therapy:  Psychoeducational Skills  Participation Level:  Active  Participation Quality:  Monopolizing  Affect:  Excited  Cognitive:  Appropriate  Insight:  Appropriate  Engagement in Group:  Monopolizing  Modes of Intervention:  Education  Summary of Progress/Problems: The patient rated her day as a 4 or 5 out of a possible 10. She states that her doctor got frustrated with her and walked away from her earlier in the day. She would like to fire her doctor and speak with another provider. The patient anticipates being discharged either Wednesday of Thursday.   Cheryl Baxter S 09/30/2023, 9:17 PM

## 2023-09-30 NOTE — Group Note (Signed)
 Date:  09/30/2023 Time:  10:51 AM  Group Topic/Focus:  Coping With Mental Health Crisis:   The purpose of this group is to help patients identify strategies for coping with mental health crisis.  Group discusses possible causes of crisis and ways to manage them effectively. Goals Group:   The focus of this group is to help patients establish daily goals to achieve during treatment and discuss how the patient can incorporate goal setting into their daily lives to aide in recovery. Orientation:   The focus of this group is to educate the patient on the purpose and policies of crisis stabilization and provide a format to answer questions about their admission.  The group details unit policies and expectations of patients while admitted.    Participation Level:  Did Not Attend   Cheryl Baxter 09/30/2023, 10:51 AM

## 2023-09-30 NOTE — Group Note (Signed)
 LCSW Group Therapy Note   Group Date: 09/30/2023 Start Time: 1100 End Time: 1200  Participation:  patient was present for the last 10 minutes of the group session.  Patient asked for print-outs.  Patient was unable to attend the entire group session because she had a visitor.     Type of Therapy:  Group Therapy  Topic:  Understanding Your Path to Change  Objective:  The goal is to help individuals understand the stages of change, identify where they currently are in the process, and provide actionable next steps to continue moving forward in their journey of change.  Goals: Learn about the six stages of change:  Precontemplation, Contemplation, Preparation, Action, Maintenance, and Relapse Reflect on Current Change Efforts:  Recognize which stage participants are in regarding a personal change. Plan Next Steps for Moving Forward:  Create an action plan based on their current stage of change.  Class Summary:  In this session, we explored the Stages of Change as a framework to understand the process of change.  We discussed how each stage helps individuals recognize where they are in their personal journey and used the Stages of Change Worksheet for self-reflection. Participants answered questions to better understand their current stage, challenges, and progress. We also emphasized the importance of moving forward, even if setbacks (Relapse) occur, and created actionable steps to help participants continue progressing. By the end of the session, participants gained a clearer understanding of their path to change and left with a clear plan for next steps.  Therapeutic Modalities:  Elements of CBT (cognitive restructuring, problem solving)  Element of DBT (mindfulness, distress tolerance)   Javar Eshbach O Ellyana Crigler, LCSWA 09/30/2023  12:12 PM

## 2023-10-01 MED ORDER — TRAZODONE HCL 100 MG PO TABS
100.0000 mg | ORAL_TABLET | Freq: Every evening | ORAL | Status: DC | PRN
  Administered 2023-10-01 – 2023-10-03 (×3): 100 mg via ORAL
  Filled 2023-10-01 (×3): qty 1

## 2023-10-01 NOTE — Progress Notes (Signed)
   09/30/23 2045  Psych Admission Type (Psych Patients Only)  Admission Status Involuntary  Psychosocial Assessment  Patient Complaints Anxiety  Eye Contact Intense  Facial Expression Anxious  Affect Anxious;Flat  Speech Soft  Interaction Isolative  Motor Activity Fidgety;Restless  Appearance/Hygiene In scrubs  Behavior Characteristics Cooperative;Calm  Mood Pleasant  Thought Process  Coherency Disorganized  Content WDL  Delusions None reported or observed  Perception WDL  Hallucination None reported or observed  Judgment UTA  Confusion None  Danger to Self  Current suicidal ideation?  (Denies)  Agreement Not to Harm Self Yes  Description of Agreement Notify Staff.  Danger to Others  Danger to Others None reported or observed

## 2023-10-01 NOTE — Plan of Care (Signed)
  Problem: Education: Goal: Knowledge of [facility] Education information/materials will improve Outcome: Progressing Goal: Verbalization of understanding the information provided will improve Outcome: Progressing   Problem: Activity: Goal: Interest or engagement in activities will improve Outcome: Progressing   Problem: Coping: Goal: Ability to verbalize frustrations and anger appropriately will improve Outcome: Progressing Goal: Ability to demonstrate self-control will improve Outcome: Progressing

## 2023-10-01 NOTE — [facility] Group Notes (Signed)
 Adult Psychoeducational Group Note  Date:  10/01/2023 Time:  8:46 PM  Group Topic/Focus:  Wrap-Up Group:   The focus of this group is to help patients review their daily goal of treatment and discuss progress on daily workbooks.  Participation Level:  Did Not Attend  Cheryl Baxter 10/01/2023, 8:46 PM

## 2023-10-01 NOTE — Plan of Care (Signed)

## 2023-10-01 NOTE — Group Note (Signed)
 Recreation Therapy Group Note   Group Topic:Other  Group Date: 10/01/2023 Start Time: 1412 End Time: 1457 Facilitators: Oaklie Durrett-McCall, LRT,CTRS Location: [address] Dayroom   Activity Description/Intervention: Therapeutic Drumming. Patients with peers and staff were given the opportunity to engage in a leader facilitated [facility] Empowerment Drumming Circle with staff from the [employer], in partnership with [employer]. Teaching laboratory technician and trained [employer], Kathlyne Parchment leading with LRT observing and documenting intervention and pt response. This evidenced-based practice targets 7 areas of health and wellbeing in the human experience including: stress-reduction, exercise, self-expression, camaraderie/support, nurturing, spirituality, and music-making (leisure).   Goal Area(s) Addresses:  Patient will engage in pro-social way in music group.  Patient will follow directions of drum leader on the first prompt. Patient will demonstrate no behavioral issues during group.  Patient will identify if a reduction in stress level occurs as a result of participation in therapeutic drum circle.    Education: Leisure exposure, Pharmacologist, Musical expression, Discharge Planning   Affect/Mood: Appropriate   Participation Level: Engaged   Participation Quality: Independent   Behavior: Appropriate   Speech/Thought Process: Focused   Insight: Good   Judgement: Good   Modes of Intervention: Teaching laboratory technician   Patient Response to Interventions:  Engaged   Education Outcome:  In group clarification offered    Clinical Observations/Individualized Feedback: Jennilee was engaged and attentive during group. Pt appeared to be having a good time. Pt left early and didn't return.     Plan: Continue to engage patient in RT group sessions 2-3x/week.   Nica Friske-McCall, LRT,CTRS 10/01/2023 3:48 PM

## 2023-10-01 NOTE — Group Note (Unsigned)
 Date:  10/01/2023 Time:  9:07 AM  Group Topic/Focus:  Goals Group:   The focus of this group is to help patients establish daily goals to achieve during treatment and discuss how the patient can incorporate goal setting into their daily lives to aide in recovery.     Participation Level:  {B[facility] PARTICIPATION ZOXWR:60454}  Participation Quality:  {B[facility] PARTICIPATION QUALITY:22265}  Affect:  {B[facility] AFFECT:22266}  Cognitive:  {[facility] COGNITIVE:22267}  Insight: {BHH Insight2:20797}  Engagement in Group:  {B[facility] ENGAGEMENT IN UJWJX:91478}  Modes of Intervention:  {B[facility] MODES OF INTERVENTION:22269}  Additional Comments:  ***  [name] Form 10/01/2023, 9:07 AM

## 2023-10-01 NOTE — [facility] Group Notes (Signed)
 Spirituality Group   Description: Participant directed exploration of values, beliefs and meaning   Following a brief framework of chaplain's role and ground rules of group behavior, participants are invited to share concerns or questions that engage spiritual life. Emphasis placed on common themes and shared experiences and ways to make meaning and clarify living into one's values.   Theory/Process/Goal: Utilize the theoretical framework of group therapy established by Derrell Flight, Relational Cultural Theory and Rogerian approaches to facilitate relational empathy and use of the "here and now" to foster reflection, self-awareness, and sharing.   Observations: Nanami was a very active participant in group, more outgoing than Monday.  Fayola Meckes L. [name], M.Div [phone]

## 2023-10-01 NOTE — Progress Notes (Signed)
 [facility] MD Progress Note  10/01/2023 6:03 PM Cheryl Baxter  MRN:  [id number]  Reason for admission: 33 yo AAF with PPH reported of schizophrenia (?), bipolar disorder, PTSD, insomnia. Patient was agitated in ED and received Geodon  in ED due to inappropriately touching staff and agitation.    Daily notes: Cheryl Baxter was seen in her room this afternoon. Chart reviewed. Patient's case & progress was reviewed during the treatment team meeting this afternoon. She presents tearful/emotional. She reports, I'm a little disappointed this morning with Dr. Lindle Rhea. He had told me that I will be discharged today. The next thing I know, he came back this morning & told me that I will not be discharged today because he talked to my mom who does not really know much about my mental health. What ever that my mother told Dr. Lindle Rhea, he believed my mom & chose to not believe me & keep his word. Yes, I had a suicidal thoughts & I acted on it, but that does not mean I did not want to live. I was just having a bad day that very day. Although, I could not go home today as I had hoped, I'm trying to channel my thoughts into something positive. I'm trying to make a collage of pictures as a symbol for my work-out plan to help me stay mentally stable after discharge. I wished the doctor has given me the benefit of doubt & discharge me today as he had promised. But I'm okay now. I'm doing well on my medicines. But last night, I had to asked for a second sleepng pill. Patient currently denies any SIHI, AVH, delusional thoughts or paranoia. She does not appear to be responding to any internal stimuli. Patient is encouraged to continue to channel her energy into something positive. She is explained that the decision her doctor made to not discharge her today is to her benefit as he wanted to make sure that once she gets discharged from here that she will go home & live her love in a more stable mood. And with patient aware, her Trazodone is increased to  100 mg po Q hs for insomnia. See the treatment plan below. Vital signs remain stable.  Principal Problem: Bipolar I disorder, current or most recent episode depressed, with psychotic features (HCC) Diagnosis: Principal Problem:   Bipolar I disorder, current or most recent episode depressed, with psychotic features (HCC) Active Problems:   Cocaine use disorder, mild, abuse (HCC)   Chronic post-traumatic stress disorder (PTSD)  Total Time spent with patient: 35 minutes  Past Psychiatric History: see H&P  Past Medical History:  Past Medical History:  Diagnosis Date   Anemia    Herpes genitalia    HIV exposure 12/2015    Past Surgical History:  Procedure Laterality Date   NO PAST SURGERIES     Family History: History reviewed. No pertinent family history. Family Psychiatric  History: see H&P Social History:  Social History   Substance and Sexual Activity  Alcohol Use Yes     Social History   Substance and Sexual Activity  Drug Use No    Social History   Socioeconomic History   Marital status: Single    Spouse name: Not on file   Number of children: Not on file   Years of education: Not on file   Highest education level: Not on file  Occupational History   Not on file  Tobacco Use   Smoking status: Every Day    Types: Cigars  Smokeless tobacco: Never  Substance and Sexual Activity   Alcohol use: Yes   Drug use: No   Sexual activity: Yes    Birth control/protection: None  Other Topics Concern   Not on file  Social History Narrative   Not on file   Social Drivers of Health   Financial Resource Strain: Not on file  Food Insecurity: No Food Insecurity (09/26/2023)   Hunger Vital Sign    Worried About Running Out of Food in the Last Year: Never true    Ran Out of Food in the Last Year: Never true  Transportation Needs: No Transportation Needs (09/26/2023)   PRAPARE - Administrator, Civil Service (Medical): No    Lack of Transportation  (Non-Medical): No  Physical Activity: Not on file  Stress: Not on file  Social Connections: Not on file   Additional Social History:   Sleep: Fair Estimated Sleeping Duration (Last 24 Hours): 8.75-9.00 hours  Appetite:  Fair  Current Medications: Current Facility-Administered Medications  Medication Dose Route Frequency Provider Last Rate Last Admin   acetaminophen  (TYLENOL ) tablet 650 mg  650 mg Oral Q6H PRN Motley-Mangrum, Jadeka A, PMHNP       albuterol (VENTOLIN HFA) 108 (90 Base) MCG/ACT inhaler 1 puff  1 puff Inhalation Q4H PRN Zouev, Dmitri, MD       alum & mag hydroxide-simeth (MAALOX/MYLANTA) 200-200-20 MG/5ML suspension 30 mL  30 mL Oral Q4H PRN Motley-Mangrum, Jadeka A, PMHNP       haloperidol (HALDOL) tablet 5 mg  5 mg Oral TID PRN Motley-Mangrum, Jadeka A, PMHNP       And   diphenhydrAMINE  (BENADRYL ) capsule 50 mg  50 mg Oral TID PRN Motley-Mangrum, Jadeka A, PMHNP       haloperidol lactate (HALDOL) injection 5 mg  5 mg Intramuscular TID PRN Motley-Mangrum, Jadeka A, PMHNP       And   diphenhydrAMINE  (BENADRYL ) injection 50 mg  50 mg Intramuscular TID PRN Motley-Mangrum, Jadeka A, PMHNP       And   LORazepam (ATIVAN) injection 2 mg  2 mg Intramuscular TID PRN Motley-Mangrum, Jadeka A, PMHNP       haloperidol lactate (HALDOL) injection 10 mg  10 mg Intramuscular TID PRN Motley-Mangrum, Jadeka A, PMHNP       And   diphenhydrAMINE  (BENADRYL ) injection 50 mg  50 mg Intramuscular TID PRN Motley-Mangrum, Jadeka A, PMHNP       And   LORazepam (ATIVAN) injection 2 mg  2 mg Intramuscular TID PRN Motley-Mangrum, Jadeka A, PMHNP       hydrOXYzine  (ATARAX ) tablet 25 mg  25 mg Oral TID PRN Motley-Mangrum, Jadeka A, PMHNP   25 mg at 09/29/23 2112   lithium carbonate (LITHOBID) ER tablet 300 mg  300 mg Oral Q12H Zouev, Dmitri, MD   300 mg at 10/01/23 1015   magnesium hydroxide (MILK OF MAGNESIA) suspension 30 mL  30 mL Oral Daily PRN Motley-Mangrum, Jadeka A, PMHNP       QUEtiapine  (SEROQUEL) tablet 100 mg  100 mg Oral Daily Zouev, Dmitri, MD   100 mg at 10/01/23 1014   QUEtiapine (SEROQUEL) tablet 300 mg  300 mg Oral QHS Zouev, Dmitri, MD   300 mg at 09/30/23 2131   QUEtiapine (SEROQUEL) tablet 50 mg  50 mg Oral Q6H PRN Zouev, Dmitri, MD   50 mg at 09/28/23 2100   traZODone (DESYREL) tablet 50 mg  50 mg Oral QHS PRN Zouev, Dmitri, MD   50  mg at 10/01/23 0018    Lab Results:  No results found for this or any previous visit (from the past 48 hours).   Blood Alcohol level:  Lab Results  Component Value Date   [facility] <15 09/25/2023    Metabolic Disorder Labs: Lab Results  Component Value Date   HGBA1C 4.9 09/28/2023   MPG 93.93 09/28/2023   No results found for: PROLACTIN Lab Results  Component Value Date   CHOL 199 09/28/2023   TRIG 83 09/28/2023   HDL 71 09/28/2023   CHOLHDL 2.8 09/28/2023   VLDL 17 09/28/2023   LDLCALC 111 (H) 09/28/2023   Musculoskeletal: Strength & Muscle Tone: within normal limits Gait & Station: normal Patient leans: N/A  Psychiatric Specialty Exam:  Presentation  General Appearance:  Disheveled  Eye Contact: Fair  Speech: Slow  Speech Volume: Decreased  Handedness:No data recorded  Mood and Affect  Mood: Depressed; Dysphoric; Irritable  Affect: Tearful; Congruent  Thought Process  Thought Processes: Goal Directed  Descriptions of Associations:Intact  Orientation:Full (Time, Place and Person)  Thought Content:Logical  History of Schizophrenia/Schizoaffective disorder:Yes  Duration of Psychotic Symptoms:Greater than six months  Hallucinations:No data recorded  Ideas of Reference:Paranoia  Suicidal Thoughts:No data recorded  Homicidal Thoughts:No data recorded   Sensorium  Memory: Immediate Fair  Judgment: Poor  Insight: Poor   Executive Functions  Concentration: Fair  Attention Span: Fair  Recall: [employer] of Knowledge: Fair  Language: Fair   Psychomotor Activity   Psychomotor Activity: No data recorded   Assets  Assets: Communication Skills; Desire for Improvement; Financial Resources/Insurance; Housing; Social Support   Sleep  Sleep: No data recorded    Physical Exam: Physical exam:  General: Well developed, well nourished, facial piercing, African American female Pupils: Normal at 3mm Respiratory: Breathing is unlabored.  Cardiovascular: No edema.  Language: No anomia, no aphasia Muscle strength and tone-pt moving all extremities.  Gait not assessed as pt remained in bed.  Neuro: Facial muscles are symmetric. Pt without tremor, no evidence of hyperarousal.  Review of Systems  Constitutional: Negative.   HENT: Negative.    Eyes: Negative.   Respiratory: Negative.    Cardiovascular: Negative.   Gastrointestinal: Negative.   Genitourinary: Negative.   Musculoskeletal: Negative.   Skin: Negative.   Neurological: Negative.   Endo/Heme/Allergies: Negative.   Psychiatric/Behavioral:  Positive for depression and substance abuse. The patient is nervous/anxious.    Blood pressure 113/81, pulse 73, temperature 98 F (36.7 C), temperature source Oral, resp. rate 16, height 5' 1 (1.549 m), weight 102.4 kg, SpO2 100%, unknown if currently breastfeeding. Body mass index is 42.66 kg/m.  Treatment Plan Summary: 33 yo F with PPH reported of schizophrenia (?), bipolar disorder, PTSD, insomnia. Patient was agitated in ED and received Geodon  in ED due to inappropriately touching staff and agitaion. Patient reports history of manic episodes but recently presenting with MDE and psychosis. Differential includes schizoaffective disorder, bipolar type and stimulant induced mood disorder. Patient minimizes cocaine abuse. Patient admits to attempting overdose and patient had contacated mother she was suicidal. AT high acute risk for suicide. Will need collateral from mother. Discussed r/b/a of Seroquel for bipolar depression and PTSD and patient is  agreeable.    Treatment Plan Summary: Daily contact with patient to assess and evaluate symptoms and progress in treatment, Medication management, and Plan start Seroquel   Observation Level/Precautions:  15 minute checks  Laboratory:  CBC Chemistry Profile Folic Acid HbAIC HCG UDS UA Vitamin B-12  Psychotherapy:  Medications:  Seroquel  Consultations:    Discharge Concerns:  attempted suicide, mood lability, collateral from mother  Estimated LOS: 2-4 days  Other:      Safety and Monitoring: INVOLUTARILY (second exam completed) admission to inpatient psychiatric unit for safety, stabilization and treatment Daily contact with patient to assess and evaluate symptoms and progress in treatment Patient's case to be discussed in multi-disciplinary team meeting Observation Level : q15 minute checks Vital signs: q12 hours Precautions: suicide, elopement, and assault   2. Psychiatric Problems #Bipolar I disorder, MRE depressed with psychotic features Continue Seroquel 100 mg daily for mood control. Continue 300 mg po at bedtime for mood control.  Continue  Seroquel 50 mg q6h-PRN for anxiety. Stopped Risperdal as patient primarily present with depression.  Increased Trazodone from 50 mg to 100 mg po Q hs prn for insomnia.  Continue Lithium Carbonate ER 300 mg po bid for mood stabilization.  #PTSD, chronic Continue Seroquel 100 mg daily and 300 mg at bedtime + 50 mg q6h-PRN for anxiety   #cocaine abuse #unspecified psychosis (rule out stimulant induced psychosis) Resolved with Seroquel  #cannabis abuse   3. Medical Management Covid negative CMP: w nl CBC: unremarkable EtOH: <10 UDS: +benzos, cocaine, THC TSH: wnl A1C:  4.9 Lipids: LDL 111     #asthma -cont Albuterol inhaler 1 puff q6h-PRN   Physician Treatment Plan for Primary Diagnosis: Bipolar I disorder, current or most recent episode depressed, with psychotic features (HCC) Long Term Goal(s): Improvement in  symptoms so as ready for discharge   Short Term Goals: Ability to identify changes in lifestyle to reduce recurrence of condition will improve, Ability to verbalize feelings will improve, Ability to disclose and discuss suicidal ideas, Ability to demonstrate self-control will improve, Ability to identify and develop effective coping behaviors will improve, Ability to maintain clinical measurements within normal limits will improve, Compliance with prescribed medications will improve, and Ability to identify triggers associated with substance abuse/mental health issues will improve   I certify that inpatient services furnished can reasonably be expected to improve the patient's condition.   Asuncion Layer, NP, pmhnp, fnp-bc. 10/01/2023, 6:03 PM Patient ID: Tami Falcon, female   DOB: 1991-01-26, 33 y.o.   MRN: [id number]

## 2023-10-01 NOTE — Progress Notes (Signed)
   10/01/23 0900  Psych Admission Type (Psych Patients Only)  Admission Status Involuntary  Psychosocial Assessment  Patient Complaints Anxiety  Eye Contact Intense  Facial Expression Anxious  Affect Flat  Speech Soft  Interaction Isolative  Motor Activity Fidgety  Appearance/Hygiene In scrubs  Behavior Characteristics Cooperative  Mood Pleasant  Thought Process  Coherency WDL  Content WDL  Delusions None reported or observed  Perception WDL  Hallucination None reported or observed  Judgment Poor  Confusion None  Danger to Self  Current suicidal ideation? Denies  Agreement Not to Harm Self Yes  Description of Agreement verbal  Danger to Others  Danger to Others None reported or observed

## 2023-10-01 NOTE — Progress Notes (Signed)
   10/01/23 2000  Psych Admission Type (Psych Patients Only)  Admission Status Involuntary  Psychosocial Assessment  Patient Complaints Anxiety  Eye Contact Fair  Facial Expression Animated  Affect Appropriate to circumstance  Speech Logical/coherent  Interaction Assertive  Motor Activity Fidgety  Appearance/Hygiene In scrubs  Behavior Characteristics Cooperative;Appropriate to situation  Mood Pleasant  Thought Process  Coherency WDL  Content WDL  Delusions None reported or observed  Perception WDL  Hallucination None reported or observed  Judgment Poor  Confusion None  Danger to Self  Current suicidal ideation? Denies  Agreement Not to Harm Self Yes  Description of Agreement verbal  Danger to Others  Danger to Others None reported or observed

## 2023-10-02 ENCOUNTER — Ambulatory Visit (HOSPITAL_COMMUNITY): Admitting: Licensed Clinical Social Worker

## 2023-10-02 MED ORDER — WHITE PETROLATUM EX OINT
TOPICAL_OINTMENT | CUTANEOUS | Status: AC
  Filled 2023-10-02: qty 5

## 2023-10-02 MED ORDER — ENSURE PLUS HIGH PROTEIN PO LIQD
237.0000 mL | Freq: Three times a day (TID) | ORAL | Status: DC
  Filled 2023-10-02 (×5): qty 237

## 2023-10-02 NOTE — Progress Notes (Signed)
   10/02/23 0800  Psych Admission Type (Psych Patients Only)  Admission Status Involuntary  Psychosocial Assessment  Patient Complaints Anxiety  Eye Contact Fair  Facial Expression Animated  Affect Flat  Speech Logical/coherent  Interaction Assertive  Motor Activity Fidgety  Appearance/Hygiene Unremarkable  Behavior Characteristics Cooperative;Appropriate to situation  Mood Euthymic  Thought Process  Coherency WDL  Content WDL  Delusions None reported or observed  Perception WDL  Hallucination None reported or observed  Judgment Poor  Confusion None  Danger to Self  Current suicidal ideation? Plan  Agreement Not to Harm Self Yes  Description of Agreement verbal  Danger to Others  Danger to Others None reported or observed

## 2023-10-02 NOTE — Plan of Care (Signed)
   Problem: Education: Goal: Emotional status will improve Outcome: Progressing Goal: Mental status will improve Outcome: Progressing Goal: Verbalization of understanding the information provided will improve Outcome: Progressing

## 2023-10-02 NOTE — Progress Notes (Signed)
 Adult Psychoeducational Group Note  Date:  10/02/2023 Time:  9:13 PM  Group Topic/Focus:  Wrap-Up Group:   The focus of this group is to help patients review their daily goal of treatment and discuss progress on daily workbooks.  Participation Level:  Active  Participation Quality:  Intrusive  Affect:  Labile  Cognitive:  Lacking  Insight: Lacking  Engagement in Group:  Lacking  Modes of Intervention:  Limit-setting  Additional Comments:  BH need more food for diebetic. The one positive thing happen talk to her family on phone made her laugh Caye Cocks 10/02/2023, 9:13 PM

## 2023-10-02 NOTE — Group Note (Signed)
 LCSW Group Therapy Note   Group Date: 10/02/2023 Start Time: 1100 End Time: 1200  Participation:  patient was present   Type of Therapy:  Group Therapy   Topic:  Healing From Within: Understanding Our Past, Building Our Future"  Objective:  To help participants understand the impact of early experiences on mental and physical health, with a focus on Adverse Childhood Experiences (ACEs), and to explore ways to build resilience and healing.  Group Goals: Understand ACEs and Their Impact: Learn how childhood experiences shape mental and physical health. Build Resilience: Develop strategies for overcoming challenges and creating positive change. Promote Healing: Recognize the value of support and the possibility of healing through therapy and self-care.  Summary: In today's session, we discussed how early experiences, especially ACEs, impact mental and physical health. We explored the effects of stress, abuse, and neglect on brain development and well-being. The group focused on resilience, understanding that healing and positive change are possible with support and self-awareness.  Therapeutic Modalities Used: Psychoeducation: Sharing information about ACEs and their effects. Cognitive Behavioral Therapy (CBT): Helping reframe negative thought patterns. Trauma-Informed Therapy: Creating a safe, supportive space for healing.   Jordyne Poehlman O Twylla Arceneaux, LCSWA 10/02/2023  7:34 PM

## 2023-10-02 NOTE — Progress Notes (Signed)
 [facility] MD Progress Note  10/02/2023 6:11 PM Cheryl Baxter  MRN:  [id number]  Reason for admission: 33 yo AAF with PPH reported of schizophrenia (?), bipolar disorder, PTSD, insomnia. Patient was agitated in ED and received Geodon  in ED due to inappropriately touching staff and agitation.    Daily notes: Cheryl Baxter was seen this morning. Chart reviewed. Patient's case & progress were discussed during the treatment team meeting this afternoon. She presents alert & oriented. She is visible on the unit, attending group sessions. She is showing improving symptoms. She reports, It is going pretty well this morning for me. I slept well last night. I finished my collage of pictures yesterday. They look pretty. My mood is neutral today, so is my anxiety. I give both #5. I feel like I should be discharged because I got stuff to do at home, bills to pay & everything else. Patient is instructed & encouraged to be patient as we still have to check her lithium level prior to discharge, and this is scheduled for Sunday morning. Patient currently denies any SIHI, AVH, delusional thoughts or paranoia. She does not appear to be responding to any internal stimuli. Patient is encouraged to continue to channel her energy into something positive, which she took very well. She has asked for some ensure. Continue current treatment plan as noted below. Vital signs remain stable.  Principal Problem: Bipolar I disorder, current or most recent episode depressed, with psychotic features (HCC) Diagnosis: Principal Problem:   Bipolar I disorder, current or most recent episode depressed, with psychotic features (HCC) Active Problems:   Cocaine use disorder, mild, abuse (HCC)   Chronic post-traumatic stress disorder (PTSD)  Total Time spent with patient: 35 minutes  Past Psychiatric History: see H&P  Past Medical History:  Past Medical History:  Diagnosis Date   Anemia    Herpes genitalia    HIV exposure 12/2015    Past  Surgical History:  Procedure Laterality Date   NO PAST SURGERIES     Family History: History reviewed. No pertinent family history. Family Psychiatric  History: see H&P Social History:  Social History   Substance and Sexual Activity  Alcohol Use Yes     Social History   Substance and Sexual Activity  Drug Use No    Social History   Socioeconomic History   Marital status: Single    Spouse name: Not on file   Number of children: Not on file   Years of education: Not on file   Highest education level: Not on file  Occupational History   Not on file  Tobacco Use   Smoking status: Every Day    Types: Cigars   Smokeless tobacco: Never  Substance and Sexual Activity   Alcohol use: Yes   Drug use: No   Sexual activity: Yes    Birth control/protection: None  Other Topics Concern   Not on file  Social History Narrative   Not on file   Social Drivers of Health   Financial Resource Strain: Not on file  Food Insecurity: No Food Insecurity (09/26/2023)   Hunger Vital Sign    Worried About Running Out of Food in the Last Year: Never true    Ran Out of Food in the Last Year: Never true  Transportation Needs: No Transportation Needs (09/26/2023)   PRAPARE - Administrator, Civil Service (Medical): No    Lack of Transportation (Non-Medical): No  Physical Activity: Not on file  Stress: Not on file  Social Connections: Not on file   Additional Social History:   Sleep: Fair Estimated Sleeping Duration (Last 24 Hours): 7.75-8.00 hours  Appetite:  Fair  Current Medications: Current Facility-Administered Medications  Medication Dose Route Frequency Provider Last Rate Last Admin   acetaminophen  (TYLENOL ) tablet 650 mg  650 mg Oral Q6H PRN Motley-Mangrum, Jadeka A, PMHNP       albuterol (VENTOLIN HFA) 108 (90 Base) MCG/ACT inhaler 1 puff  1 puff Inhalation Q4H PRN Zouev, Dmitri, MD       alum & mag hydroxide-simeth (MAALOX/MYLANTA) 200-200-20 MG/5ML suspension 30  mL  30 mL Oral Q4H PRN Motley-Mangrum, Jadeka A, PMHNP       haloperidol (HALDOL) tablet 5 mg  5 mg Oral TID PRN Motley-Mangrum, Jadeka A, PMHNP       And   diphenhydrAMINE  (BENADRYL ) capsule 50 mg  50 mg Oral TID PRN Motley-Mangrum, Jadeka A, PMHNP       haloperidol lactate (HALDOL) injection 5 mg  5 mg Intramuscular TID PRN Motley-Mangrum, Jadeka A, PMHNP       And   diphenhydrAMINE  (BENADRYL ) injection 50 mg  50 mg Intramuscular TID PRN Motley-Mangrum, Jadeka A, PMHNP       And   LORazepam (ATIVAN) injection 2 mg  2 mg Intramuscular TID PRN Motley-Mangrum, Jadeka A, PMHNP       haloperidol lactate (HALDOL) injection 10 mg  10 mg Intramuscular TID PRN Motley-Mangrum, Jadeka A, PMHNP       And   diphenhydrAMINE  (BENADRYL ) injection 50 mg  50 mg Intramuscular TID PRN Motley-Mangrum, Jadeka A, PMHNP       And   LORazepam (ATIVAN) injection 2 mg  2 mg Intramuscular TID PRN Motley-Mangrum, Jadeka A, PMHNP       feeding supplement (ENSURE PLUS HIGH PROTEIN) liquid 237 mL  237 mL Oral TID BM Viraaj Vorndran I, NP       hydrOXYzine  (ATARAX ) tablet 25 mg  25 mg Oral TID PRN Motley-Mangrum, Jadeka A, PMHNP   25 mg at 09/29/23 2112   lithium carbonate (LITHOBID) ER tablet 300 mg  300 mg Oral Q12H Zouev, Dmitri, MD   300 mg at 10/02/23 0749   magnesium hydroxide (MILK OF MAGNESIA) suspension 30 mL  30 mL Oral Daily PRN Motley-Mangrum, Jadeka A, PMHNP       QUEtiapine (SEROQUEL) tablet 100 mg  100 mg Oral Daily Zouev, Dmitri, MD   100 mg at 10/02/23 0749   QUEtiapine (SEROQUEL) tablet 300 mg  300 mg Oral QHS Zouev, Dmitri, MD   300 mg at 10/01/23 2119   QUEtiapine (SEROQUEL) tablet 50 mg  50 mg Oral Q6H PRN Zouev, Dmitri, MD   50 mg at 09/28/23 2100   traZODone (DESYREL) tablet 100 mg  100 mg Oral QHS PRN Asuncion Layer I, NP   100 mg at 10/01/23 2119    Lab Results:  No results found for this or any previous visit (from the past 48 hours).   Blood Alcohol level:  Lab Results  Component Value Date    [facility] <15 09/25/2023    Metabolic Disorder Labs: Lab Results  Component Value Date   HGBA1C 4.9 09/28/2023   MPG 93.93 09/28/2023   No results found for: PROLACTIN Lab Results  Component Value Date   CHOL 199 09/28/2023   TRIG 83 09/28/2023   HDL 71 09/28/2023   CHOLHDL 2.8 09/28/2023   VLDL 17 09/28/2023   LDLCALC 111 (H) 09/28/2023   Musculoskeletal: Strength & Muscle Tone: within  normal limits Gait & Station: normal Patient leans: N/A  Psychiatric Specialty Exam:  Presentation  General Appearance:  Casual; Fairly Groomed  Eye Contact: Good  Speech: Clear and Coherent; Normal Rate  Speech Volume: Normal  Handedness:Right   Mood and Affect  Mood: -- (Improving)  Affect: Congruent  Thought Process  Thought Processes: Coherent; Goal Directed; Linear  Descriptions of Associations:Intact  Orientation:Full (Time, Place and Person)  Thought Content:Logical  History of Schizophrenia/Schizoaffective disorder:Yes  Duration of Psychotic Symptoms:Greater than six months  Hallucinations:Hallucinations: None Description of Auditory Hallucinations: NA   Ideas of Reference:None  Suicidal Thoughts:Suicidal Thoughts: No   Homicidal Thoughts:Homicidal Thoughts: No    Sensorium  Memory: Immediate Good; Recent Good; Remote Good  Judgment: Fair  Insight: Fair   Art therapist  Concentration: Fair  Attention Span: Fair  Recall: Good  Fund of Knowledge: Fair  Language: Good   Psychomotor Activity  Psychomotor Activity: Psychomotor Activity: Normal    Assets  Assets: Communication Skills; Desire for Improvement; Financial Resources/Insurance; Housing; Physical Health; Resilience; Social Support   Sleep  Sleep: Sleep: Good Number of Hours of Sleep: 9.25   Physical Exam: Physical exam:  General: Well developed, well nourished, facial piercing, African American female Pupils: Normal at 3mm Respiratory: Breathing  is unlabored.  Cardiovascular: No edema.  Language: No anomia, no aphasia Muscle strength and tone-pt moving all extremities.  Gait not assessed as pt remained in bed.  Neuro: Facial muscles are symmetric. Pt without tremor, no evidence of hyperarousal.  Review of Systems  Constitutional: Negative.   HENT: Negative.    Eyes: Negative.   Respiratory: Negative.    Cardiovascular: Negative.   Gastrointestinal: Negative.   Genitourinary: Negative.   Musculoskeletal: Negative.   Skin: Negative.   Neurological: Negative.   Endo/Heme/Allergies: Negative.   Psychiatric/Behavioral:  Positive for depression (Improving) and substance abuse (hx of). Negative for hallucinations and memory loss. The patient is not nervous/anxious and does not have insomnia.    Blood pressure 111/75, pulse 70, temperature 98.4 F (36.9 C), temperature source Oral, resp. rate 15, height 5' 1 (1.549 m), weight 102.4 kg, SpO2 100%, unknown if currently breastfeeding. Body mass index is 42.66 kg/m.  Treatment Plan Summary: 33 yo F with PPH reported of schizophrenia (?), bipolar disorder, PTSD, insomnia. Patient was agitated in ED and received Geodon  in ED due to inappropriately touching staff and agitaion. Patient reports history of manic episodes but recently presenting with MDE and psychosis. Differential includes schizoaffective disorder, bipolar type and stimulant induced mood disorder. Patient minimizes cocaine abuse. Patient admits to attempting overdose and patient had contacated mother she was suicidal. AT high acute risk for suicide. Will need collateral from mother. Discussed r/b/a of Seroquel for bipolar depression and PTSD and patient is agreeable.    Treatment Plan Summary: Daily contact with patient to assess and evaluate symptoms and progress in treatment, Medication management, and Plan start Seroquel   Observation Level/Precautions:  15 minute checks  Laboratory:  CBC Chemistry Profile Folic  Acid HbAIC HCG UDS UA Vitamin B-12  Psychotherapy:    Medications:  Seroquel  Consultations:    Discharge Concerns:  attempted suicide, mood lability, collateral from mother  Estimated LOS: 2-4 days  Other:      Safety and Monitoring: INVOLUTARILY (second exam completed) admission to inpatient psychiatric unit for safety, stabilization and treatment Daily contact with patient to assess and evaluate symptoms and progress in treatment Patient's case to be discussed in multi-disciplinary team meeting Observation Level : q15  minute checks Vital signs: q12 hours Precautions: suicide, elopement, and assault   2. Psychiatric Problems #Bipolar I disorder, MRE depressed with psychotic features Continue Seroquel 100 mg daily for mood control. Continue 300 mg po at bedtime for mood control.  Continue  Seroquel 50 mg q6h-PRN for anxiety. Stopped Risperdal as patient primarily present with depression.  Continue Trazodone 100 mg po Q hs prn for insomnia.  Continue Lithium Carbonate ER 300 mg po bid for mood stabilization. Continue ensure 237 ml tid between meals for nutritional supplementation.  Lithium level to be obtained on 10-04-23.  #PTSD, chronic Continue Seroquel 100 mg daily and 300 mg at bedtime + 50 mg q6h-PRN for anxiety   #cocaine abuse #unspecified psychosis (rule out stimulant induced psychosis) Resolved with Seroquel  #cannabis abuse   3. Medical Management Covid negative CMP: w nl CBC: unremarkable EtOH: <10 UDS: +benzos, cocaine, THC TSH: wnl A1C:  4.9 Lipids: LDL 111     #asthma -cont Albuterol inhaler 1 puff q6h-PRN   Physician Treatment Plan for Primary Diagnosis: Bipolar I disorder, current or most recent episode depressed, with psychotic features (HCC) Long Term Goal(s): Improvement in symptoms so as ready for discharge   Short Term Goals: Ability to identify changes in lifestyle to reduce recurrence of condition will improve, Ability to verbalize  feelings will improve, Ability to disclose and discuss suicidal ideas, Ability to demonstrate self-control will improve, Ability to identify and develop effective coping behaviors will improve, Ability to maintain clinical measurements within normal limits will improve, Compliance with prescribed medications will improve, and Ability to identify triggers associated with substance abuse/mental health issues will improve   I certify that inpatient services furnished can reasonably be expected to improve the patient's condition.   Asuncion Layer, NP, pmhnp, fnp-bc. 10/02/2023, 6:11 PM Patient ID: Cheryl Baxter, female   DOB: [birth year], 33 y.o.   MRN: [id number] Patient ID: Cheryl Baxter, female   DOB: [birth year], 33 y.o.   MRN: [id number]

## 2023-10-02 NOTE — BHH Group Notes (Signed)
 Adult Psychoeducational Group Note  Date:  10/02/2023 Time:  11:50 AM  Group Topic/Focus:  Goals Group:   The focus of this group is to help patients establish daily goals to achieve during treatment and discuss how the patient can incorporate goal setting into their daily lives to aide in recovery.  Participation Level:  Active  Participation Quality:  Appropriate and Attentive  Affect:  Appropriate  Cognitive:  Appropriate  Insight: Good  Engagement in Group:  Distracting and Engaged  Modes of Intervention:  Exploration  Additional Comments:  Pt participated in Goals group. Pt stated her goal is to remain positive. Pt identified looking forward to calling and speaking with kids today.    Cheryl Baxter 10/02/2023, 11:50 AM

## 2023-10-02 NOTE — Plan of Care (Signed)
  Problem: Education: Goal: Emotional status will improve Outcome: Progressing Goal: Verbalization of understanding the information provided will improve Outcome: Progressing   Problem: Activity: Goal: Sleeping patterns will improve Outcome: Progressing   Problem: Coping: Goal: Ability to verbalize frustrations and anger appropriately will improve Outcome: Progressing   Problem: Health Behavior/Discharge Planning: Goal: Compliance with treatment plan for underlying cause of condition will improve Outcome: Progressing

## 2023-10-03 ENCOUNTER — Encounter (HOSPITAL_COMMUNITY): Payer: Self-pay

## 2023-10-03 NOTE — Progress Notes (Signed)
   10/03/23 0900  Psych Admission Type (Psych Patients Only)  Admission Status Involuntary  Psychosocial Assessment  Patient Complaints None  Eye Contact Fair  Facial Expression Flat;Animated  Affect Appropriate to circumstance  Speech Logical/coherent  Interaction Assertive  Motor Activity Slow;Fidgety  Appearance/Hygiene Unremarkable  Behavior Characteristics Cooperative;Appropriate to situation  Mood Pleasant  Thought Process  Coherency WDL  Content WDL  Delusions None reported or observed  Perception WDL  Hallucination None reported or observed  Judgment Impaired  Confusion None  Danger to Self  Current suicidal ideation? Denies  Agreement Not to Harm Self Yes  Description of Agreement verbal  Danger to Others  Danger to Others None reported or observed

## 2023-10-03 NOTE — Group Note (Signed)
 Date:  10/03/2023 Time:  11:07 AM  Group Topic/Focus:  Goals Group:   The focus of this group is to help patients establish daily goals to achieve during treatment and discuss how the patient can incorporate goal setting into their daily lives to aide in recovery.    Participation Level:  Did Not Attend  Participation Quality:    Affect:    Cognitive:    Insight:   Engagement in Group:    Modes of Intervention: Pt were aware of group time. Pt refused to attend.  Additional Comments:    [name] Form 10/03/2023, 11:07 AM

## 2023-10-03 NOTE — Group Note (Signed)
 Date:  10/03/2023 Time:  12:16 PM  Group Topic/Focus:  Wellness Toolbox:   The focus of this group is to discuss various aspects of wellness, balancing those aspects and exploring ways to increase the ability to experience wellness.  Patients will create a wellness toolbox for use upon discharge.    Participation Level:  Minimal  Participation Quality:  Appropriate  Affect:  Appropriate  Cognitive:  Appropriate  Insight: Limited  Engagement in Group:  Distracting  Modes of Intervention:  Education  Additional Comments:    [name] Form 10/03/2023, 12:16 PM

## 2023-10-03 NOTE — Plan of Care (Signed)
  Problem: Education: Goal: Emotional status will improve Outcome: Progressing Goal: Verbalization of understanding the information provided will improve Outcome: Progressing   Problem: Coping: Goal: Ability to verbalize frustrations and anger appropriately will improve Outcome: Progressing

## 2023-10-03 NOTE — Group Note (Signed)
 Recreation Therapy Group Note   Group Topic:Team Building  Group Date: 10/03/2023 Start Time: 0935 End Time: 1005 Facilitators: Bain Whichard-McCall, LRT,CTRS Location: [address] Dayroom   Group Topic: Communication, Team Building, Problem Solving  Goal Area(s) Addresses:  Patient will effectively work with peer towards shared goal.  Patient will identify skills used to make activity successful.  Patient will identify how skills used during activity can be applied to reach post d/c goals.   Behavioral Response: Engaged  Intervention: STEM Activity- Glass blower/designer  Activity: Tallest [employer]. In teams of 5-6, patients were given 11 craft pipe cleaners. Using the materials provided, patients were instructed to compete again the opposing team(s) to build the tallest free-standing structure from floor level. The activity was timed; difficulty increased by Clinical research associate as Production designer, theatre/television/film continued.  Systematically resources were removed with additional directions for example, placing one arm behind their back, working in silence, and shape stipulations. LRT facilitated post-activity discussion reviewing team processes and necessary communication skills involved in completion. Patients were encouraged to reflect how the skills utilized, or not utilized, in this activity can be incorporated to positively impact support systems post discharge.  Education: Pharmacist, community, Scientist, physiological, Discharge Planning   Education Outcome: Acknowledges education/In group clarification offered/Needs additional education.    Affect/Mood: Appropriate   Participation Level: Engaged   Participation Quality: Independent   Behavior: Appropriate   Speech/Thought Process: Focused   Insight: Good   Judgement: Good   Modes of Intervention: STEM Activity   Patient Response to Interventions:  Engaged   Education Outcome:  In group clarification offered    Clinical  Observations/Individualized Feedback: Pt attended and participated in group session.    Plan: Continue to engage patient in RT group sessions 2-3x/week.   Jaxsin Bottomley-McCall, LRT,CTRS 10/03/2023 12:44 PM

## 2023-10-03 NOTE — Progress Notes (Signed)
Pt attended AA group 

## 2023-10-03 NOTE — Plan of Care (Signed)
   Problem: Education: Goal: Knowledge of [facility] [facility] Education information/materials will improve Outcome: Progressing Goal: Emotional status will improve Outcome: Progressing Goal: Mental status will improve Outcome: Progressing

## 2023-10-03 NOTE — Progress Notes (Cosign Needed)
 [facility] MD Progress Note  10/03/2023 1:50 PM Cheryl Baxter  MRN:  [id number]  Reason for admission: 33 yo AAF with PPH reported of schizophrenia (?), bipolar disorder, PTSD, insomnia. Patient was agitated in ED and received Geodon  in ED due to inappropriately touching staff and agitation.    Daily notes:  Cheryl Baxter was seen this morning. Chart reviewed. She presents alert & oriented x 3. She remains visible on the unit, attending group sessions. She presents with an improving affect, good eye contact & verbally responsive. She reports, My mood is improving for the most part. I'm doing well. I slept well last night. I know I feel well enough to start discussing my discharge plans & what I need to do after discharge to stay well. Patient is instructed & encouraged to be patient as her lithium level is due to be drawn & checked in the morning. At this point, this might as well be her baseline as she is becoming restless about getting discharged. Patient currently denies any SIHI, AVH, delusional thoughts or paranoia. She does not appear to be responding to any internal stimuli. Patient is encouraged to continue to attend & participate in the group sessions. If her lithium level is good tomorrow morning & she maintains mood stability as it is today, patient can be discharged. Keeping her beyond Saturday may not make any difference in her mood as she is currently focused on getting discharged. Continue current treatment plan as noted below. Vital signs reviewed, b/p is currently elevated 130/96. Staff will recheck. Patient is currently in no apparent distress.  Principal Problem: Bipolar I disorder, current or most recent episode depressed, with psychotic features (HCC) Diagnosis: Principal Problem:   Bipolar I disorder, current or most recent episode depressed, with psychotic features (HCC) Active Problems:   Cocaine use disorder, mild, abuse (HCC)   Chronic post-traumatic stress disorder (PTSD)  Total Time  spent with patient: 35 minutes  Past Psychiatric History: see H&P  Past Medical History:  Past Medical History:  Diagnosis Date   Anemia    Herpes genitalia    HIV exposure 12/2015    Past Surgical History:  Procedure Laterality Date   NO PAST SURGERIES     Family History: History reviewed. No pertinent family history.  Family Psychiatric  History: See H&P  Social History:  Social History   Substance and Sexual Activity  Alcohol Use Yes     Social History   Substance and Sexual Activity  Drug Use No    Social History   Socioeconomic History   Marital status: Single    Spouse name: Not on file   Number of children: Not on file   Years of education: Not on file   Highest education level: Not on file  Occupational History   Not on file  Tobacco Use   Smoking status: Every Day    Types: Cigars   Smokeless tobacco: Never  Substance and Sexual Activity   Alcohol use: Yes   Drug use: No   Sexual activity: Yes    Birth control/protection: None  Other Topics Concern   Not on file  Social History Narrative   Not on file   Social Drivers of Health   Financial Resource Strain: Not on file  Food Insecurity: No Food Insecurity (09/26/2023)   Hunger Vital Sign    Worried About Running Out of Food in the Last Year: Never true    Ran Out of Food in the Last Year: Never true  Transportation  Needs: No Transportation Needs (09/26/2023)   PRAPARE - Administrator, Civil Service (Medical): No    Lack of Transportation (Non-Medical): No  Physical Activity: Not on file  Stress: Not on file  Social Connections: Not on file   Additional Social History:   Sleep: Good Estimated Sleeping Duration (Last 24 Hours): 7.00-8.25 hours  Appetite:  Fair  Current Medications: Current Facility-Administered Medications  Medication Dose Route Frequency Provider Last Rate Last Admin   acetaminophen  (TYLENOL ) tablet 650 mg  650 mg Oral Q6H PRN Motley-Mangrum, Jadeka A,  PMHNP       albuterol (VENTOLIN HFA) 108 (90 Base) MCG/ACT inhaler 1 puff  1 puff Inhalation Q4H PRN Zouev, Dmitri, MD       alum & mag hydroxide-simeth (MAALOX/MYLANTA) 200-200-20 MG/5ML suspension 30 mL  30 mL Oral Q4H PRN Motley-Mangrum, Jadeka A, PMHNP       haloperidol (HALDOL) tablet 5 mg  5 mg Oral TID PRN Motley-Mangrum, Jadeka A, PMHNP       And   diphenhydrAMINE  (BENADRYL ) capsule 50 mg  50 mg Oral TID PRN Motley-Mangrum, Jadeka A, PMHNP       haloperidol lactate (HALDOL) injection 5 mg  5 mg Intramuscular TID PRN Motley-Mangrum, Jadeka A, PMHNP       And   diphenhydrAMINE  (BENADRYL ) injection 50 mg  50 mg Intramuscular TID PRN Motley-Mangrum, Jadeka A, PMHNP       And   LORazepam (ATIVAN) injection 2 mg  2 mg Intramuscular TID PRN Motley-Mangrum, Jadeka A, PMHNP       haloperidol lactate (HALDOL) injection 10 mg  10 mg Intramuscular TID PRN Motley-Mangrum, Jadeka A, PMHNP       And   diphenhydrAMINE  (BENADRYL ) injection 50 mg  50 mg Intramuscular TID PRN Motley-Mangrum, Jadeka A, PMHNP       And   LORazepam (ATIVAN) injection 2 mg  2 mg Intramuscular TID PRN Motley-Mangrum, Jadeka A, PMHNP       hydrOXYzine  (ATARAX ) tablet 25 mg  25 mg Oral TID PRN Motley-Mangrum, Jadeka A, PMHNP   25 mg at 09/29/23 2112   lithium carbonate (LITHOBID) ER tablet 300 mg  300 mg Oral Q12H Zouev, Dmitri, MD   300 mg at 10/03/23 0824   magnesium hydroxide (MILK OF MAGNESIA) suspension 30 mL  30 mL Oral Daily PRN Motley-Mangrum, Jadeka A, PMHNP       QUEtiapine (SEROQUEL) tablet 100 mg  100 mg Oral Daily Zouev, Dmitri, MD   100 mg at 10/03/23 0824   QUEtiapine (SEROQUEL) tablet 300 mg  300 mg Oral QHS Zouev, Dmitri, MD   300 mg at 10/02/23 2115   QUEtiapine (SEROQUEL) tablet 50 mg  50 mg Oral Q6H PRN Zouev, Dmitri, MD   50 mg at 09/28/23 2100   traZODone (DESYREL) tablet 100 mg  100 mg Oral QHS PRN Asuncion Layer I, NP   100 mg at 10/02/23 2115    Lab Results:  No results found for this or any previous  visit (from the past 48 hours).   Blood Alcohol level:  Lab Results  Component Value Date   [facility] <15 09/25/2023    Metabolic Disorder Labs: Lab Results  Component Value Date   HGBA1C 4.9 09/28/2023   MPG 93.93 09/28/2023   No results found for: PROLACTIN Lab Results  Component Value Date   CHOL 199 09/28/2023   TRIG 83 09/28/2023   HDL 71 09/28/2023   CHOLHDL 2.8 09/28/2023   VLDL 17 09/28/2023  LDLCALC 111 (H) 09/28/2023   Musculoskeletal: Strength & Muscle Tone: within normal limits Gait & Station: normal Patient leans: N/A  Psychiatric Specialty Exam:  Presentation  General Appearance:  Casual; Fairly Groomed  Eye Contact: Good  Speech: Clear and Coherent; Normal Rate  Speech Volume: Normal  Handedness:Right   Mood and Affect  Mood: -- (Improving)  Affect: Congruent  Thought Process  Thought Processes: Coherent; Goal Directed; Linear  Descriptions of Associations:Intact  Orientation:Full (Time, Place and Person)  Thought Content:Logical  History of Schizophrenia/Schizoaffective disorder:Yes  Duration of Psychotic Symptoms:Greater than six months  Hallucinations:Hallucinations: None Description of Auditory Hallucinations: NA   Ideas of Reference:None  Suicidal Thoughts:Suicidal Thoughts: No   Homicidal Thoughts:Homicidal Thoughts: No    Sensorium  Memory: Immediate Good; Recent Good; Remote Good  Judgment: Fair  Insight: Fair   Art therapist  Concentration: Fair  Attention Span: Fair  Recall: Good  Fund of Knowledge: Fair  Language: Good   Psychomotor Activity  Psychomotor Activity: Psychomotor Activity: Normal    Assets  Assets: Communication Skills; Desire for Improvement; Financial Resources/Insurance; Housing; Physical Health; Resilience; Social Support   Sleep  Sleep: Sleep: Good Number of Hours of Sleep: 9.25   Physical Exam: Physical exam:  General: Well developed, well  nourished, facial piercing, African American female Pupils: Normal at 3mm Respiratory: Breathing is unlabored.  Cardiovascular: No edema.  Language: No anomia, no aphasia Muscle strength and tone-pt moving all extremities.  Gait not assessed as pt remained in bed.  Neuro: Facial muscles are symmetric. Pt without tremor, no evidence of hyperarousal.  Review of Systems  Constitutional: Negative.   HENT: Negative.    Eyes: Negative.   Respiratory: Negative.    Cardiovascular: Negative.   Gastrointestinal: Negative.   Genitourinary: Negative.   Musculoskeletal: Negative.   Skin: Negative.   Neurological: Negative.   Endo/Heme/Allergies: Negative.   Psychiatric/Behavioral:  Positive for depression (Improving) and substance abuse (hx of). Negative for hallucinations and memory loss. The patient is not nervous/anxious and does not have insomnia.    Blood pressure (!) 130/96, pulse 85, temperature 98.4 F (36.9 C), temperature source Oral, resp. rate 18, height 5' 1 (1.549 m), weight 102.4 kg, SpO2 100%, unknown if currently breastfeeding. Body mass index is 42.66 kg/m.  Treatment Plan Summary: 33 yo F with PPH reported of schizophrenia (?), bipolar disorder, PTSD, insomnia. Patient was agitated in ED and received Geodon  in ED due to inappropriately touching staff and agitaion. Patient reports history of manic episodes but recently presenting with MDE and psychosis. Differential includes schizoaffective disorder, bipolar type and stimulant induced mood disorder. Patient minimizes cocaine abuse. Patient admits to attempting overdose and patient had contacated mother she was suicidal. AT high acute risk for suicide. Will need collateral from mother. Discussed r/b/a of Seroquel for bipolar depression and PTSD and patient is agreeable.    Treatment Plan Summary: Daily contact with patient to assess and evaluate symptoms and progress in treatment, Medication management, and Plan start Seroquel    Observation Level/Precautions:  15 minute checks  Laboratory:  CBC Chemistry Profile Folic Acid HbAIC HCG UDS UA Vitamin B-12  Psychotherapy:    Medications:  Seroquel  Consultations:    Discharge Concerns:  attempted suicide, mood lability, collateral from mother  Estimated LOS: 2-4 days  Other:      Safety and Monitoring: INVOLUTARILY (second exam completed) admission to inpatient psychiatric unit for safety, stabilization and treatment Daily contact with patient to assess and evaluate symptoms and progress in treatment  Patient's case to be discussed in multi-disciplinary team meeting Observation Level : q15 minute checks Vital signs: q12 hours Precautions: suicide, elopement, and assault   2. Psychiatric Problems #Bipolar I disorder, MRE depressed with psychotic features Continue Seroquel 100 mg daily for mood control. Continue 300 mg po at bedtime for mood control.  Continue  Seroquel 50 mg q6h-PRN for anxiety. Stopped Risperdal as patient primarily present with depression.  Continue Trazodone 100 mg po Q hs prn for insomnia.  Continue Lithium Carbonate ER 300 mg po bid for mood stabilization. Continue ensure 237 ml tid between meals for nutritional supplementation.  Lithium level to be obtained on tomorrow 10-04-23.  #PTSD, chronic Continue Seroquel 100 mg daily and 300 mg at bedtime + 50 mg q6h-PRN for anxiety   #cocaine abuse #unspecified psychosis (rule out stimulant induced psychosis) Resolved with Seroquel  #cannabis abuse   3. Medical Management Covid negative CMP: w nl CBC: unremarkable EtOH: <10 UDS: +benzos, cocaine, THC TSH: wnl A1C:  4.9 Lipids: LDL 111     #asthma -cont Albuterol inhaler 1 puff q6h-PRN   Physician Treatment Plan for Primary Diagnosis: Bipolar I disorder, current or most recent episode depressed, with psychotic features (HCC) Long Term Goal(s): Improvement in symptoms so as ready for discharge   Short Term Goals: Ability  to identify changes in lifestyle to reduce recurrence of condition will improve, Ability to verbalize feelings will improve, Ability to disclose and discuss suicidal ideas, Ability to demonstrate self-control will improve, Ability to identify and develop effective coping behaviors will improve, Ability to maintain clinical measurements within normal limits will improve, Compliance with prescribed medications will improve, and Ability to identify triggers associated with substance abuse/mental health issues will improve   I certify that inpatient services furnished can reasonably be expected to improve the patient's condition.   Asuncion Layer, NP, pmhnp, fnp-bc. 10/03/2023, 1:50 PM Patient ID: Cheryl Baxter, female   DOB: Sep 17, 1990, 33 y.o.   MRN: [id number] Patient ID: Cheryl Baxter, female   DOB: 07/12/1990, 34 y.o.   MRN: [id number] Patient ID: Cheryl Baxter, female   DOB: 1990/12/20, 33 y.o.   MRN: [id number]

## 2023-10-03 NOTE — BH IP Treatment Plan (Signed)
 Interdisciplinary Treatment and Diagnostic Plan Update  10/03/2023 Time of Session: 12:20 PM - UPDATE Cheryl Baxter MRN: [id number]  Principal Diagnosis: Bipolar I disorder, current or most recent episode depressed, with psychotic features (HCC)  Secondary Diagnoses: Principal Problem:   Bipolar I disorder, current or most recent episode depressed, with psychotic features (HCC) Active Problems:   Cocaine use disorder, mild, abuse (HCC)   Chronic post-traumatic stress disorder (PTSD)   Current Medications:  Current Facility-Administered Medications  Medication Dose Route Frequency Provider Last Rate Last Admin   acetaminophen  (TYLENOL ) tablet 650 mg  650 mg Oral Q6H PRN Motley-Mangrum, Jadeka A, PMHNP       albuterol (VENTOLIN HFA) 108 (90 Base) MCG/ACT inhaler 1 puff  1 puff Inhalation Q4H PRN Zouev, Dmitri, MD       alum & mag hydroxide-simeth (MAALOX/MYLANTA) 200-200-20 MG/5ML suspension 30 mL  30 mL Oral Q4H PRN Motley-Mangrum, Jadeka A, PMHNP       haloperidol (HALDOL) tablet 5 mg  5 mg Oral TID PRN Motley-Mangrum, Jadeka A, PMHNP       And   diphenhydrAMINE  (BENADRYL ) capsule 50 mg  50 mg Oral TID PRN Motley-Mangrum, Jadeka A, PMHNP       haloperidol lactate (HALDOL) injection 5 mg  5 mg Intramuscular TID PRN Motley-Mangrum, Jadeka A, PMHNP       And   diphenhydrAMINE  (BENADRYL ) injection 50 mg  50 mg Intramuscular TID PRN Motley-Mangrum, Jadeka A, PMHNP       And   LORazepam (ATIVAN) injection 2 mg  2 mg Intramuscular TID PRN Motley-Mangrum, Jadeka A, PMHNP       haloperidol lactate (HALDOL) injection 10 mg  10 mg Intramuscular TID PRN Motley-Mangrum, Jadeka A, PMHNP       And   diphenhydrAMINE  (BENADRYL ) injection 50 mg  50 mg Intramuscular TID PRN Motley-Mangrum, Jadeka A, PMHNP       And   LORazepam (ATIVAN) injection 2 mg  2 mg Intramuscular TID PRN Motley-Mangrum, Jadeka A, PMHNP       hydrOXYzine  (ATARAX ) tablet 25 mg  25 mg Oral TID PRN Motley-Mangrum, Jadeka A, PMHNP    25 mg at 09/29/23 2112   lithium carbonate (LITHOBID) ER tablet 300 mg  300 mg Oral Q12H Zouev, Dmitri, MD   300 mg at 10/03/23 0824   magnesium hydroxide (MILK OF MAGNESIA) suspension 30 mL  30 mL Oral Daily PRN Motley-Mangrum, Jadeka A, PMHNP       QUEtiapine (SEROQUEL) tablet 100 mg  100 mg Oral Daily Zouev, Dmitri, MD   100 mg at 10/03/23 0824   QUEtiapine (SEROQUEL) tablet 300 mg  300 mg Oral QHS Zouev, Dmitri, MD   300 mg at 10/02/23 2115   QUEtiapine (SEROQUEL) tablet 50 mg  50 mg Oral Q6H PRN Zouev, Dmitri, MD   50 mg at 09/28/23 2100   traZODone (DESYREL) tablet 100 mg  100 mg Oral QHS PRN Nwoko, Agnes I, NP   100 mg at 10/02/23 2115   PTA Medications: Medications Prior to Admission  Medication Sig Dispense Refill Last Dose/Taking   calcium-vitamin D (OSCAL WITH D) 500-5 MG-MCG tablet Take 1 tablet by mouth daily with breakfast.      QUEtiapine (SEROQUEL) 50 MG tablet Take 50 mg by mouth at bedtime.      risperiDONE (RISPERDAL) 2 MG tablet Take 2 mg by mouth at bedtime.       Patient Stressors: Medication change or noncompliance   Substance abuse   Traumatic event  Patient Strengths: Capable of independent living  Communication skills  Physical Health  Supportive family/friends   Treatment Modalities: Medication Management, Group therapy, Case management,  1 to 1 session with clinician, Psychoeducation, Recreational therapy.   Physician Treatment Plan for Primary Diagnosis: Bipolar I disorder, current or most recent episode depressed, with psychotic features (HCC) Long Term Goal(s): Improvement in symptoms so as ready for discharge   Short Term Goals: Ability to identify changes in lifestyle to reduce recurrence of condition will improve Ability to verbalize feelings will improve Ability to disclose and discuss suicidal ideas Ability to demonstrate self-control will improve Ability to identify and develop effective coping behaviors will improve Ability to maintain  clinical measurements within normal limits will improve Compliance with prescribed medications will improve Ability to identify triggers associated with substance abuse/mental health issues will improve  Medication Management: Evaluate patient's response, side effects, and tolerance of medication regimen.  Therapeutic Interventions: 1 to 1 sessions, Unit Group sessions and Medication administration.  Evaluation of Outcomes: Progressing  Physician Treatment Plan for Secondary Diagnosis: Principal Problem:   Bipolar I disorder, current or most recent episode depressed, with psychotic features (HCC) Active Problems:   Cocaine use disorder, mild, abuse (HCC)   Chronic post-traumatic stress disorder (PTSD)  Long Term Goal(s): Improvement in symptoms so as ready for discharge   Short Term Goals: Ability to identify changes in lifestyle to reduce recurrence of condition will improve Ability to verbalize feelings will improve Ability to disclose and discuss suicidal ideas Ability to demonstrate self-control will improve Ability to identify and develop effective coping behaviors will improve Ability to maintain clinical measurements within normal limits will improve Compliance with prescribed medications will improve Ability to identify triggers associated with substance abuse/mental health issues will improve     Medication Management: Evaluate patient's response, side effects, and tolerance of medication regimen.  Therapeutic Interventions: 1 to 1 sessions, Unit Group sessions and Medication administration.  Evaluation of Outcomes: Progressing   RN Treatment Plan for Primary Diagnosis: Bipolar I disorder, current or most recent episode depressed, with psychotic features (HCC) Long Term Goal(s): Knowledge of disease and therapeutic regimen to maintain health will improve  Short Term Goals: Ability to remain free from injury will improve, Ability to verbalize frustration and anger  appropriately will improve, Ability to verbalize feelings will improve, and Ability to disclose and discuss suicidal ideas  Medication Management: RN will administer medications as ordered by provider, will assess and evaluate patient's response and provide education to patient for prescribed medication. RN will report any adverse and/or side effects to prescribing provider.  Therapeutic Interventions: 1 on 1 counseling sessions, Psychoeducation, Medication administration, Evaluate responses to treatment, Monitor vital signs and CBGs as ordered, Perform/monitor CIWA, COWS, AIMS and Fall Risk screenings as ordered, Perform wound care treatments as ordered.  Evaluation of Outcomes: Progressing   LCSW Treatment Plan for Primary Diagnosis: Bipolar I disorder, current or most recent episode depressed, with psychotic features (HCC) Long Term Goal(s): Safe transition to appropriate next level of care at discharge, Engage patient in therapeutic group addressing interpersonal concerns.  Short Term Goals: Engage patient in aftercare planning with referrals and resources, Increase ability to appropriately verbalize feelings, Facilitate acceptance of mental health diagnosis and concerns, and Identify triggers associated with mental health/substance abuse issues  Therapeutic Interventions: Assess for all discharge needs, 1 to 1 time with Social worker, Explore available resources and support systems, Assess for adequacy in community support network, Educate family and significant other(s) on suicide prevention,  Complete Psychosocial Assessment, Interpersonal group therapy.  Evaluation of Outcomes: Progressing   Progress in Treatment: Attending groups: Yes. Participating in groups: Yes. Taking medication as prescribed: Yes. Toleration medication: Yes. Family/Significant other contact made: Yes, contacted Gyselle Matthew (mother) [phone]  Patient understands diagnosis: Yes. Discussing patient  identified problems/goals with staff: Yes. Medical problems stabilized or resolved: Yes. Denies suicidal/homicidal ideation: Yes. Issues/concerns per patient self-inventory: No.   New problem(s) identified: No, Describe:  None   New Short Term/Long Term Goal(s): medication stabilization, elimination of SI thoughts, development of comprehensive mental wellness plan.    Patient Goals:  Ensure I have follow-up with a psychiatrist & therapist   Discharge Plan or Barriers: Patient recently admitted. CSW will continue to follow and assess for appropriate referrals and possible discharge planning.      Reason for Continuation of Hospitalization: Anxiety Medication stabilization Other; describe Mood stabilization, discharge planning   Estimated Length of Stay: 1 - 2 days  Last 3 [place] Suicide Severity Risk Score: Flowsheet Row Ad[facility] (Current) [facility] from 09/25/2023 in [facility] at [facility] ED from 09/07/2023 in [facility] Emergency Department at [facility]  C-SSRS RISK CATEGORY High Risk High Risk No Risk    Last [facility] 2/9 Scores:     No data to display          Scribe for Treatment Team: Luci Bellucci O Yarianna Varble, LCSWA 10/03/2023 7:32 PM

## 2023-10-03 NOTE — Group Note (Unsigned)
 Date:  10/03/2023 Time:  9:22 AM  Group Topic/Focus:  Goals Group:   The focus of this group is to help patients establish daily goals to achieve during treatment and discuss how the patient can incorporate goal setting into their daily lives to aide in recovery.     Participation Level:  {BHH PARTICIPATION DGUYQ:03474}  Participation Quality:  {BHH PARTICIPATION QUALITY:22265}  Affect:  {BHH AFFECT:22266}  Cognitive:  {BHH COGNITIVE:22267}  Insight: {BHH Insight2:20797}  Engagement in Group:  {BHH ENGAGEMENT IN QVZDG:38756}  Modes of Intervention:  {BHH MODES OF INTERVENTION:22269}  Additional Comments:  ***  [name] Form 10/03/2023, 9:22 AM

## 2023-10-04 LAB — BASIC METABOLIC PANEL WITH GFR
Anion gap: 13 (ref 5–15)
BUN: 13 mg/dL (ref 6–20)
CO2: 23 mmol/L (ref 22–32)
Calcium: 9.4 mg/dL (ref 8.9–10.3)
Chloride: 99 mmol/L (ref 98–111)
Creatinine, Ser: 0.74 mg/dL (ref 0.44–1.00)
GFR, Estimated: >60 mL/min (ref >60)
Glucose, Bld: 86 mg/dL (ref 70–99)
Potassium: 4.3 mmol/L (ref 3.5–5.1)
Sodium: 135 mmol/L (ref 135–145)

## 2023-10-04 LAB — TSH: TSH: 2.666 u[IU]/mL (ref 0.350–4.500)

## 2023-10-04 LAB — LITHIUM LEVEL: Lithium Lvl: 0.33 mmol/L — ABNORMAL LOW (ref 0.60–1.20)

## 2023-10-04 MED ORDER — LITHIUM CARBONATE ER 300 MG PO TBCR: 300.0000 mg | EXTENDED_RELEASE_TABLET | Freq: Two times a day (BID) | ORAL | 0 refills | Status: DC

## 2023-10-04 MED ORDER — QUETIAPINE FUMARATE 300 MG PO TABS: 300.0000 mg | ORAL_TABLET | Freq: Every day | ORAL | 0 refills | Status: DC

## 2023-10-04 MED ORDER — QUETIAPINE FUMARATE 50 MG PO TABS: 50.0000 mg | ORAL_TABLET | Freq: Four times a day (QID) | ORAL | 0 refills | Status: DC | PRN

## 2023-10-04 MED ORDER — ALBUTEROL SULFATE HFA 108 (90 BASE) MCG/ACT IN AERS: 1.0000 | INHALATION_SPRAY | RESPIRATORY_TRACT | 0 refills | Status: AC | PRN

## 2023-10-04 MED ORDER — TRAZODONE HCL 100 MG PO TABS: 100.0000 mg | ORAL_TABLET | Freq: Every evening | ORAL | 0 refills | Status: DC | PRN

## 2023-10-04 MED ORDER — WHITE PETROLATUM EX OINT
TOPICAL_OINTMENT | CUTANEOUS | Status: AC
  Filled 2023-10-04: qty 5

## 2023-10-04 MED ORDER — HYDROXYZINE HCL 25 MG PO TABS: 25.0000 mg | ORAL_TABLET | Freq: Three times a day (TID) | ORAL | 0 refills | Status: DC | PRN

## 2023-10-04 MED ORDER — QUETIAPINE FUMARATE 100 MG PO TABS: 100.0000 mg | ORAL_TABLET | Freq: Every day | ORAL | 0 refills | Status: AC

## 2023-10-04 NOTE — [facility]HH Suicide Risk Assessment (Signed)
 Suicide Risk Assessment  Discharge Assessment    [facility] Discharge Suicide Risk Assessment   Principal Problem: Bipolar I disorder, current or most recent episode depressed, with psychotic features [facility]) Discharge Diagnoses: Principal Problem:   Bipolar I disorder, current or most recent episode depressed, with psychotic features (HCC) Active Problems:   Cocaine use disorder, mild, abuse (HCC)   Chronic post-traumatic stress disorder (PTSD)   Total Time spent with patient: 30 minutes  Musculoskeletal: Strength & Muscle Tone: within normal limits Gait & Station: normal Patient leans: N/A  Psychiatric Specialty Exam  Presentation  General Appearance:  Casual  Eye Contact: Good  Speech: Clear and Coherent; Normal Rate  Speech Volume: Normal  Handedness: Right   Mood and Affect  Mood: Euthymic  Duration of Depression Symptoms: Greater than two weeks  Affect: Appropriate; Full Range   Thought Process  Thought Processes: Coherent; Linear  Descriptions of Associations:Intact  Orientation:Full (Time, Place and Person)  Thought Content:Logical  History of Schizophrenia/Schizoaffective disorder:Yes  Duration of Psychotic Symptoms:Greater than six months  Hallucinations:Hallucinations: None  Ideas of Reference:None  Suicidal Thoughts:Suicidal Thoughts: No  Homicidal Thoughts:Homicidal Thoughts: No   Sensorium  Memory: Immediate Good; Recent Good; Remote Good  Judgment: Good  Insight: Good   Executive Functions  Concentration: Good  Attention Span: Good  Recall: Good  Fund of Knowledge: Good  Language: Good   Psychomotor Activity  Psychomotor Activity:Psychomotor Activity: Normal   Assets  Assets: Communication Skills; Desire for Improvement; Housing; Physical Health; Resilience; Social Support   Sleep  Sleep:Sleep: Good  Estimated Sleeping Duration (Last 24 Hours): 6.75-7.25 hours  Physical Exam: Physical Exam See  discharge summary ROS See discharge summary Blood pressure 119/80, pulse 85, temperature 98.3 F (36.8 C), temperature source Oral, resp. rate 16, height 5' 1 (1.549 m), weight 102.4 kg, SpO2 100%, unknown if currently breastfeeding. Body mass index is 42.66 kg/m.  Mental Status Per Nursing Assessment::   On Admission:  NA  Demographic Factors:  Low socioeconomic status  Loss Factors: Financial problems/change in socioeconomic status  Historical Factors: Impulsivity  Risk Reduction Factors:   Responsible for children under 94 years of age, Sense of responsibility to family, Religious beliefs about death, Positive social support, Positive therapeutic relationship, and Positive coping skills or problem solving skills  Continued Clinical Symptoms:  More than one psychiatric diagnosis Previous Psychiatric Diagnoses and Treatments  Cognitive Features That Contribute To Risk:  None    Suicide Risk:  Minimal: No identifiable suicidal ideation.  Patients presenting with no risk factors but with morbid ruminations; may be classified as minimal risk based on the severity of the depressive symptoms   Follow-up Information     [facility]. Go on 10/15/2023.   Specialty: Behavioral Health Why: Due to no shows, you must go during walk in times for services.  Please go to this provider on 10/15/23 at 7:00 am for an assessment, to obtain medication management services. You may also go on Monday through Friday, arrive by 7:00 am. Contact information: [address] [place]  [address] [phone]        [facility]. Go on 10/13/2023.   Specialty: Professional Counselor Why: Please go to this provider on 10/13/23 at 9:00 am for an assessment, to obtain therapy services. You may also go on Monday through Friday, from 9 am to 1 pm. Contact information: [address]  [address] [place] [address] [phone]  Plan Of Care/Follow-up recommendations:  See discharge summary  Blair Chiquita Hint, NP 10/04/2023, 11:38 AM

## 2023-10-04 NOTE — Group Note (Signed)
 LCSW Group Therapy Note 10/04/2023 Type of Therapy and Topic:  Group Therapy: Gratitude Participation Level:  Active Description of Group:   In this group, patients shared and discussed a positive experience that had beneficial impact on their life and those around them.  The group discussed how bringing the positive elements of their lives to the forefront of their minds can help with recovery from a physical or mental illness.  An exercise was done as a group to state mindfulness routines in order to reduce stress or anxiety level encourage participants to consider other potential positives in their lives.  Therapeutic Goals: Patients will participate by sharing a positive or impactful event that assisted to increase self-awareness and self-esteem.  Patients will discuss how to apply mindfulness technique with any situations. Patients will explore other coping skills to utilize during emotional dysregulation to assist with their daily tasks.   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  Patient was engage and share her thoughts. Therapeutic Modalities:   Solution-Focused Therapy Activity  Cheryl Baxter Cheryl Baxter, LCSWA 10/04/2023  12:41 PM

## 2023-10-04 NOTE — Progress Notes (Signed)
 Adult Psychoeducational Group Note  Date:  10/04/2023 Time:  10:07 AM  Group Topic/Focus:  Goals Group:   The focus of this group is to help patients establish daily goals to achieve during treatment and discuss how the patient can incorporate goal setting into their daily lives to aide in recovery.  Participation Level:  Active  Participation Quality:  Appropriate  Affect:  Appropriate  Cognitive:  Appropriate  Insight: Appropriate  Engagement in Group:  Engaged  Modes of Intervention:  Discussion  Additional Comments:  Pt stated she is feeling well.  Pt goal for the day is to be discharged.  Daine Pillar D 10/04/2023, 10:07 AM

## 2023-10-04 NOTE — Progress Notes (Signed)
 Pt discharged from the Adult Unit at [facility] on 10/03/20 at 1320 to the lobby where a taxi was waiting to transport her home. Pt was given her discharge instructions and instructions were explained. Pt verbalized understanding. All contents of pt's locker were returned. Pt denies SI and AVH.

## 2023-10-04 NOTE — Progress Notes (Signed)
   10/04/23 0800  Psych Admission Type (Psych Patients Only)  Admission Status Involuntary  Psychosocial Assessment  Patient Complaints None  Eye Contact Fair  Facial Expression Animated  Affect Appropriate to circumstance  Speech Logical/coherent  Interaction Assertive  Motor Activity Other (Comment) (WNL)  Appearance/Hygiene Unremarkable  Behavior Characteristics Cooperative;Appropriate to situation  Mood Anxious;Pleasant  Thought Process  Coherency WDL  Content WDL  Delusions None reported or observed  Perception WDL  Hallucination None reported or observed  Judgment Impaired  Confusion None  Danger to Self  Current suicidal ideation? Denies  Description of Suicide Plan None  Self-Injurious Behavior No self-injurious ideation or behavior indicators observed or expressed   Agreement Not to Harm Self Yes  Description of Agreement Verbal  Danger to Others  Danger to Others None reported or observed

## 2023-10-04 NOTE — Discharge Summary (Cosign Needed Addendum)
 Physician Discharge Summary Note  Patient:  Cheryl Baxter is an 33 y.o., female MRN:  [id number] DOB:  05/05/1990 Patient phone:  [phone] (home)  Patient address:   [address] [place] [address],  Total Time spent with patient: 30 minutes  Date of Admission:  09/26/2023 Date of Discharge: 10/04/23   Reason for Admission:  33 yo AAF with PPH reported of schizophrenia (?), bipolar disorder, PTSD, insomnia. Patient was agitated in ED and received Geodon  in ED due to inappropriately touching staff and agitation.   Principal Problem: Bipolar I disorder, current or most recent episode depressed, with psychotic features [facility]) Discharge Diagnoses: Principal Problem:   Bipolar I disorder, current or most recent episode depressed, with psychotic features (HCC) Active Problems:   Cocaine use disorder, mild, abuse (HCC)   Chronic post-traumatic stress disorder (PTSD)   Past Psychiatric History: See H&P  Past Medical History:  Past Medical History:  Diagnosis Date   Anemia    Herpes genitalia    HIV exposure 12/2015    Past Surgical History:  Procedure Laterality Date   NO PAST SURGERIES     Family History: History reviewed. No pertinent family history. Family Psychiatric  History: See H&P Social History:  Social History   Substance and Sexual Activity  Alcohol Use Yes     Social History   Substance and Sexual Activity  Drug Use No    Social History   Socioeconomic History   Marital status: Single    Spouse name: Not on file   Number of children: Not on file   Years of education: Not on file   Highest education level: Not on file  Occupational History   Not on file  Tobacco Use   Smoking status: Every Day    Types: Cigars   Smokeless tobacco: Never  Substance and Sexual Activity   Alcohol use: Yes   Drug use: No   Sexual activity: Yes    Birth control/protection: None  Other Topics Concern   Not on file  Social History Narrative   Not  on file   Social Drivers of Health   Financial Resource Strain: Not on file  Food Insecurity: No Food Insecurity (09/26/2023)   Hunger Vital Sign    Worried About Running Out of Food in the Last Year: Never true    Ran Out of Food in the Last Year: Never true  Transportation Needs: No Transportation Needs (09/26/2023)   PRAPARE - Administrator, Civil Service (Medical): No    Lack of Transportation (Non-Medical): No  Physical Activity: Not on file  Stress: Not on file  Social Connections: Not on file    Hospital Course:  During the patient's hospitalization, patient had extensive initial psychiatric evaluation, and follow-up psychiatric evaluations every day.  Psychiatric diagnoses provided upon initial assessment:  Principal Problem:   Bipolar I disorder, current or most recent episode depressed, with psychotic features (HCC) Active Problems:   Cocaine use disorder, mild, abuse (HCC)   Chronic post-traumatic stress disorder (PTSD)  Patient's psychiatric medications were adjusted on admission: -Start Seroquel  50 mg BID (patient states she was already taking BID + 50 mg q6h-PRN for anxiety) -Stop Risperdal as patient primarily present with depression  During the hospitalization, other adjustments were made to the patient's psychiatric medication regimen:  -Seroquel  titrated to 100 mg daily and 300 mg nightly, with 50 mg every 6 hours as needed for anxiety   Patient's care was discussed during the interdisciplinary  team meeting every day during the hospitalization.  The patient denies having side effects to prescribed psychiatric medication.  Gradually, patient started adjusting to milieu. The patient was evaluated each day by a clinical provider to ascertain response to treatment. Improvement was noted by the patient's report of decreasing symptoms, improved sleep and appetite, affect, medication tolerance, behavior, and participation in unit programming.  Patient was  asked each day to complete a self inventory noting mood, mental status, pain, new symptoms, anxiety and concerns.    Symptoms were reported as significantly decreased or resolved completely by discharge.   On day of discharge, the patient reports that their mood is stable. The patient denied having suicidal thoughts for more than 48 hours prior to discharge.  Patient denies having homicidal thoughts.  Patient denies having auditory hallucinations.  Patient denies any visual hallucinations or other symptoms of psychosis. The patient was motivated to continue taking medication with a goal of continued improvement in mental health.   The patient reports their target psychiatric symptoms of agitation, depression, and insomnia responded well to the psychiatric medications, and the patient reports overall benefit other psychiatric hospitalization. Supportive psychotherapy was provided to the patient. The patient also participated in regular group therapy while hospitalized. Coping skills, problem solving as well as relaxation therapies were also part of the unit programming.  Labs were reviewed with the patient, and abnormal results were discussed with the patient.  The patient is able to verbalize their individual safety plan to this provider.  # It is recommended to the patient to continue psychiatric medications as prescribed, after discharge from the hospital.    # It is recommended to the patient to follow up with your outpatient psychiatric provider and PCP.  # It was discussed with the patient, the impact of alcohol, drugs, tobacco have been there overall psychiatric and medical wellbeing, and total abstinence from substance use was recommended the patient.ed.  # Prescriptions provided or sent directly to preferred pharmacy at discharge. Patient agreeable to plan. Given opportunity to ask questions. Appears to feel comfortable with discharge.    # In the event of worsening symptoms, the patient  is instructed to call the crisis hotline, 911 and or go to the nearest ED for appropriate evaluation and treatment of symptoms. To follow-up with primary care provider for other medical issues, concerns and or health care needs  # Patient was discharged home with a plan to follow up as noted below.   Physical Findings: AIMS:  , ,  0,  ,  ,  ,   CIWA:   N/A COWS:   N/A  Musculoskeletal: Strength & Muscle Tone: within normal limits Gait & Station: normal Patient leans: N/A   Psychiatric Specialty Exam:  Presentation  General Appearance:  Casual  Eye Contact: Good  Speech: Clear and Coherent; Normal Rate  Speech Volume: Normal  Handedness: Right   Mood and Affect  Mood: Euthymic  Affect: Appropriate; Full Range   Thought Process  Thought Processes: Coherent; Linear  Descriptions of Associations:Intact  Orientation:Full (Time, Place and Person)  Thought Content:Logical  History of Schizophrenia/Schizoaffective disorder:Yes  Duration of Psychotic Symptoms:Greater than six months  Hallucinations:Hallucinations: None  Ideas of Reference:None  Suicidal Thoughts:Suicidal Thoughts: No  Homicidal Thoughts:Homicidal Thoughts: No   Sensorium  Memory: Immediate Good; Recent Good; Remote Good  Judgment: Good  Insight: Good   Executive Functions  Concentration: Good  Attention Span: Good  Recall: Good  Fund of Knowledge: Good  Language: Good  Psychomotor Activity  Psychomotor Activity:Psychomotor Activity: Normal   Assets  Assets: Communication Skills; Desire for Improvement; Housing; Physical Health; Resilience; Social Support   Sleep  Sleep:Sleep: Good  Estimated Sleeping Duration (Last 24 Hours): 6.75-7.25 hours   Physical Exam: Physical Exam Vitals and nursing note reviewed.  Constitutional:      General: She is not in acute distress.    Appearance: She is obese.  HENT:     Nose: Nose normal.     Mouth/Throat:      Pharynx: Oropharynx is clear.   Cardiovascular:     Rate and Rhythm: Normal rate.     Pulses: Normal pulses.  Pulmonary:     Effort: No respiratory distress.   Musculoskeletal:        General: Normal range of motion.     Cervical back: Normal range of motion.   Skin:    General: Skin is dry.   Neurological:     General: No focal deficit present.     Mental Status: She is alert and oriented to person, place, and time. Mental status is at baseline.   Psychiatric:        Mood and Affect: Mood normal.        Behavior: Behavior normal.        Thought Content: Thought content normal.        Judgment: Judgment normal.    Review of Systems  Constitutional: Negative.   Respiratory: Negative.    Cardiovascular: Negative.   Gastrointestinal: Negative.   Skin: Negative.   Neurological: Negative.   Psychiatric/Behavioral:  Positive for depression (Stable for outpatient management) and substance abuse (UDS + Benzodiazepine, Cocaine, THC). Negative for hallucinations, memory loss and suicidal ideas. The patient is nervous/anxious (Stable for outpatient management). The patient does not have insomnia.    Blood pressure 119/80, pulse 85, temperature 98.3 F (36.8 C), temperature source Oral, resp. rate 16, height 5' 1 (1.549 m), weight 102.4 kg, SpO2 100%, unknown if currently breastfeeding. Body mass index is 42.66 kg/m.   Social History   Tobacco Use  Smoking Status Every Day   Types: Cigars  Smokeless Tobacco Never   Tobacco Cessation:  A prescription for an FDA-approved tobacco cessation medication was offered at discharge and the patient refused   Blood Alcohol level:  Lab Results  Component Value Date   [facility] S F <15 09/25/2023    Metabolic Disorder Labs:  Lab Results  Component Value Date   HGBA1C 4.9 09/28/2023   MPG 93.93 09/28/2023   No results found for: PROLACTIN Lab Results  Component Value Date   CHOL 199 09/28/2023   TRIG 83 09/28/2023   HDL 71  09/28/2023   CHOLHDL 2.8 09/28/2023   VLDL 17 09/28/2023   LDLCALC 111 (H) 09/28/2023    See Psychiatric Specialty Exam and Suicide Risk Assessment completed by Attending Physician prior to discharge.  Discharge destination:  Home  Is patient on multiple antipsychotic therapies at discharge:  No   Has Patient had three or more failed trials of antipsychotic monotherapy by history:  No  Recommended Plan for Multiple Antipsychotic Therapies: NA  Discharge Instructions     Activity as tolerated - No restrictions   Complete by: As directed    Diet - low sodium heart healthy   Complete by: As directed       Allergies as of 10/04/2023       Reactions   Penicillins Hives        Medication List  STOP taking these medications    calcium-vitamin D 500-5 MG-MCG tablet Commonly known as: OSCAL WITH D   risperiDONE 2 MG tablet Commonly known as: RISPERDAL       TAKE these medications      Indication  albuterol  108 (90 Base) MCG/ACT inhaler Commonly known as: VENTOLIN  HFA Inhale 1 puff into the lungs every 4 (four) hours as needed for wheezing or shortness of breath.  Indication: Asthma   hydrOXYzine  25 MG tablet Commonly known as: ATARAX  Take 1 tablet (25 mg total) by mouth 3 (three) times daily as needed for anxiety.  Indication: Feeling Anxious   lithium  carbonate 300 MG ER tablet Commonly known as: LITHOBID  Take 1 tablet (300 mg total) by mouth every 12 (twelve) hours.  Indication: Depressive Phase of Manic-Depression   QUEtiapine  300 MG tablet Commonly known as: SEROQUEL  Take 1 tablet (300 mg total) by mouth at bedtime. What changed: You were already taking a medication with the same name, and this prescription was added. Make sure you understand how and when to take each.  Indication: Posttraumatic Stress Disorder   QUEtiapine  50 MG tablet Commonly known as: SEROQUEL  Take 1 tablet (50 mg total) by mouth every 6 (six) hours as needed (for anxiety or  intrusive thoughts). What changed: You were already taking a medication with the same name, and this prescription was added. Make sure you understand how and when to take each.  Indication: Agitation   QUEtiapine  100 MG tablet Commonly known as: SEROQUEL  Take 1 tablet (100 mg total) by mouth daily. Start taking on: October 05, 2023 What changed:  medication strength how much to take when to take this  Indication: Mood Stability   traZODone  100 MG tablet Commonly known as: DESYREL  Take 1 tablet (100 mg total) by mouth at bedtime as needed for sleep.  Indication: Trouble Sleeping        Follow-up Information     [facility]. Go on 10/15/2023.   Specialty: Behavioral Health Why: Due to no shows, you must go during walk in times for services.  Please go to this provider on 10/15/23 at 7:00 am for an assessment, to obtain medication management services. You may also go on Monday through Friday, arrive by 7:00 am. Contact information: [address] [place]  [address] [phone]        [facility] Go on 10/13/2023.   Specialty: Professional Counselor Why: Please go to this provider on 10/13/23 at 9:00 am for an assessment, to obtain therapy services. You may also go on Monday through Friday, from 9 am to 1 pm. Contact information: [address]  [address] [place] [address] [phone]                 Follow-up recommendations:   Activity: as tolerated  Diet: heart healthy  Other: -Follow-up with your outpatient psychiatric provider -instructions on appointment date, time, and address (location) are provided to you in discharge paperwork.  -Take your psychiatric medications as prescribed at discharge - instructions are provided to you in the discharge paperwork  -Follow-up with outpatient primary care doctor and other specialists -for management of preventative medicine and chronic medical  disease  -Testing: Follow-up with outpatient provider for abnormal lab results: LDL Cholesterol slightly elevated  -If you are prescribed an atypical antipsychotic medication, we recommend that your outpatient psychiatrist follow routine screening for side effects within 3 months of discharge, including monitoring: AIMS scale, height, weight, blood pressure, fasting lipid  panel, HbA1c, and fasting blood sugar.   -Recommend total abstinence from alcohol, tobacco, and other illicit drug use at discharge.   -If your psychiatric symptoms recur, worsen, or if you have side effects to your psychiatric medications, call your outpatient psychiatric provider, 911, [phone] or go to the nearest emergency department.  -If suicidal thoughts occur, immediately call your outpatient psychiatric provider, 911, 988 or go to the nearest emergency department.    Signed: Blair Chiquita Hint, NP 10/04/2023, 11:36 AM

## 2023-10-04 NOTE — Progress Notes (Signed)
  [facility] Adult Case Management Discharge Plan :  Will you be returning to the same living situation after discharge:  Yes,  Private residence At discharge, do you have transportation home?: No. CSW will provide taxi Do you have the ability to pay for your medications: Yes,  Pt has medical coverage, denied barriers  Release of information consent forms completed and in the chart;  Patient's signature needed at discharge.  Patient to Follow up at:  Follow-up Information     [facility]. Go on 10/15/2023.   Specialty: Behavioral Health Why: Due to no shows, you must go during walk in times for services.  Please go to this provider on 10/15/23 at 7:00 am for an assessment, to obtain medication management services. You may also go on Monday through Friday, arrive by 7:00 am. Contact information: [address] [place]  [address] [phone]        [facility] Go on 10/13/2023.   Specialty: Professional Counselor Why: Please go to this provider on 10/13/23 at 9:00 am for an assessment, to obtain therapy services. You may also go on Monday through Friday, from 9 am to 1 pm. Contact information: [address]  [address] [place] [address] [phone]                 Next level of care provider has access to [facility] Link:yes  Safety Planning and Suicide Prevention discussed: Yes,  Pt's mother     Has patient been referred to the Quitline?: Patient does not use tobacco/nicotine products  Patient has been referred for addiction treatment: Yes, the patient will follow up with an outpatient provider for substance use disorder. Psychiatrist/APP: patient to schedule appointment and Therapist: patient to schedule appointment  Rolin JONETTA Kerns, LCSW 10/04/2023, 11:17 AM

## 2023-10-10 ENCOUNTER — Ambulatory Visit (HOSPITAL_COMMUNITY): Admitting: Physician Assistant

## 2024-01-26 ENCOUNTER — Ambulatory Visit (HOSPITAL_COMMUNITY)
Admission: EM | Admit: 2024-01-26 | Discharge: 2024-01-27 | Disposition: A | Discharge status: Other Acute Inpatient Hospital | Payer: MEDICAID | Attending: Psychiatry | Admitting: Psychiatry

## 2024-01-26 DIAGNOSIS — F309 Manic episode, unspecified: Secondary | ICD-10-CM | POA: Insufficient documentation

## 2024-01-26 DIAGNOSIS — F3010 Manic episode without psychotic symptoms, unspecified: Secondary | ICD-10-CM

## 2024-01-26 DIAGNOSIS — R45851 Suicidal ideations: Secondary | ICD-10-CM | POA: Diagnosis not present

## 2024-01-26 DIAGNOSIS — Z91199 Patient's noncompliance with other medical treatment and regimen due to unspecified reason: Secondary | ICD-10-CM | POA: Diagnosis not present

## 2024-01-26 DIAGNOSIS — F1910 Other psychoactive substance abuse, uncomplicated: Secondary | ICD-10-CM | POA: Insufficient documentation

## 2024-01-26 LAB — POCT URINE DRUG SCREEN - MANUAL ENTRY (I-SCREEN)
POC Amphetamine UR: POSITIVE — AB
POC Buprenorphine (BUP): NOT DETECTED
POC Cocaine UR: NOT DETECTED
POC Marijuana UR: NOT DETECTED
POC Methadone UR: NOT DETECTED
POC Methamphetamine UR: POSITIVE — AB
POC Morphine: NOT DETECTED
POC Oxazepam (BZO): NOT DETECTED
POC Oxycodone UR: NOT DETECTED
POC Secobarbital (BAR): NOT DETECTED

## 2024-01-26 LAB — CBC WITH DIFFERENTIAL/PLATELET
Abs Immature Granulocytes: 0.02 K/uL (ref 0.00–0.07)
Basophils Absolute: 0.1 K/uL (ref 0.0–0.1)
Basophils Relative: 1 %
Eosinophils Absolute: 0.2 K/uL (ref 0.0–0.5)
Eosinophils Relative: 2 %
HCT: 41.7 % (ref 36.0–46.0)
Hemoglobin: 14 g/dL (ref 12.0–15.0)
Immature Granulocytes: 0 %
Lymphocytes Relative: 31 %
Lymphs Abs: 2.8 K/uL (ref 0.7–4.0)
MCH: 27.2 pg (ref 26.0–34.0)
MCHC: 33.6 g/dL (ref 30.0–36.0)
MCV: 81 fL (ref 80.0–100.0)
Monocytes Absolute: 0.6 K/uL (ref 0.1–1.0)
Monocytes Relative: 6 %
Neutro Abs: 5.5 K/uL (ref 1.7–7.7)
Neutrophils Relative %: 60 %
Platelets: 317 K/uL (ref 150–400)
RBC: 5.15 MIL/uL — ABNORMAL HIGH (ref 3.87–5.11)
RDW: 14.4 % (ref 11.5–15.5)
WBC: 9.1 K/uL (ref 4.0–10.5)
nRBC: 0 % (ref 0.0–0.2)

## 2024-01-26 LAB — COMPREHENSIVE METABOLIC PANEL WITH GFR
ALT: 15 U/L (ref 0–44)
AST: 20 U/L (ref 15–41)
Albumin: 3.9 g/dL (ref 3.5–5.0)
Alkaline Phosphatase: 84 U/L (ref 38–126)
Anion gap: 12 (ref 5–15)
BUN: 8 mg/dL (ref 6–20)
CO2: 24 mmol/L (ref 22–32)
Calcium: 8.9 mg/dL (ref 8.9–10.3)
Chloride: 103 mmol/L (ref 98–111)
Creatinine, Ser: 0.84 mg/dL (ref 0.44–1.00)
GFR, Estimated: >60 mL/min (ref >60)
Glucose, Bld: 85 mg/dL (ref 70–99)
Potassium: 3.9 mmol/L (ref 3.5–5.1)
Sodium: 139 mmol/L (ref 135–145)
Total Bilirubin: 0.4 mg/dL (ref 0.0–1.2)
Total Protein: 7 g/dL (ref 6.5–8.1)

## 2024-01-26 LAB — TSH: TSH: 0.553 u[IU]/mL (ref 0.350–4.500)

## 2024-01-26 LAB — ETHANOL: Alcohol, Ethyl (B): <15 mg/dL (ref <15)

## 2024-01-26 LAB — POC URINE PREG, ED: Preg Test, Ur: NEGATIVE

## 2024-01-26 MED ORDER — ACETAMINOPHEN 325 MG PO TABS
650.0000 mg | ORAL_TABLET | Freq: Four times a day (QID) | ORAL | Status: DC | PRN
  Administered 2024-01-26: 650 mg via ORAL
  Filled 2024-01-26: qty 2

## 2024-01-26 MED ORDER — LORAZEPAM 2 MG/ML IJ SOLN: 2.0000 mg | Freq: Three times a day (TID) | INTRAMUSCULAR | Status: DC | PRN

## 2024-01-26 MED ORDER — DIPHENHYDRAMINE HCL 50 MG/ML IJ SOLN: 50.0000 mg | Freq: Three times a day (TID) | INTRAMUSCULAR | Status: DC | PRN

## 2024-01-26 MED ORDER — HALOPERIDOL LACTATE 5 MG/ML IJ SOLN: 5.0000 mg | Freq: Three times a day (TID) | INTRAMUSCULAR | Status: DC | PRN

## 2024-01-26 MED ORDER — ALUM & MAG HYDROXIDE-SIMETH 200-200-20 MG/5ML PO SUSP: 30.0000 mL | ORAL | Status: DC | PRN

## 2024-01-26 MED ORDER — HALOPERIDOL 5 MG PO TABS: 5.0000 mg | ORAL_TABLET | Freq: Three times a day (TID) | ORAL | Status: DC | PRN

## 2024-01-26 MED ORDER — OLANZAPINE 5 MG PO TABS
5.0000 mg | ORAL_TABLET | Freq: Once | ORAL | Status: AC
  Administered 2024-01-26: 5 mg via ORAL
  Filled 2024-01-26: qty 1

## 2024-01-26 MED ORDER — TRAZODONE HCL 100 MG PO TABS
100.0000 mg | ORAL_TABLET | Freq: Every evening | ORAL | Status: DC | PRN
  Administered 2024-01-26: 100 mg via ORAL
  Filled 2024-01-26: qty 1

## 2024-01-26 MED ORDER — QUETIAPINE FUMARATE 100 MG PO TABS
100.0000 mg | ORAL_TABLET | Freq: Every day | ORAL | Status: DC
  Administered 2024-01-27: 100 mg via ORAL
  Filled 2024-01-26: qty 1

## 2024-01-26 MED ORDER — QUETIAPINE FUMARATE 300 MG PO TABS
300.0000 mg | ORAL_TABLET | Freq: Every day | ORAL | Status: DC
Start: 2024-01-26 — End: 2024-01-27
  Administered 2024-01-26: 300 mg via ORAL
  Filled 2024-01-26: qty 1

## 2024-01-26 MED ORDER — HALOPERIDOL LACTATE 5 MG/ML IJ SOLN: 10.0000 mg | Freq: Three times a day (TID) | INTRAMUSCULAR | Status: DC | PRN

## 2024-01-26 MED ORDER — MAGNESIUM HYDROXIDE 400 MG/5ML PO SUSP: 30.0000 mL | Freq: Every day | ORAL | Status: DC | PRN

## 2024-01-26 MED ORDER — DIPHENHYDRAMINE HCL 50 MG PO CAPS: 50.0000 mg | ORAL_CAPSULE | Freq: Three times a day (TID) | ORAL | Status: DC | PRN
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
## 2024-01-27 ENCOUNTER — Other Ambulatory Visit (HOSPITAL_COMMUNITY)
Admission: EM | Admit: 2024-01-27 | Discharge: 2024-01-29 | Disposition: A | Discharge status: Other Acute Inpatient Hospital | Payer: MEDICAID | Source: Intra-hospital

## 2024-01-27 DIAGNOSIS — F1514 Other stimulant abuse with stimulant-induced mood disorder: Secondary | ICD-10-CM | POA: Insufficient documentation

## 2024-01-27 DIAGNOSIS — R45851 Suicidal ideations: Secondary | ICD-10-CM | POA: Diagnosis not present

## 2024-01-27 DIAGNOSIS — F1910 Other psychoactive substance abuse, uncomplicated: Secondary | ICD-10-CM | POA: Diagnosis present

## 2024-01-27 DIAGNOSIS — F1594 Other stimulant use, unspecified with stimulant-induced mood disorder: Secondary | ICD-10-CM

## 2024-01-27 DIAGNOSIS — F29 Unspecified psychosis not due to a substance or known physiological condition: Secondary | ICD-10-CM

## 2024-01-27 LAB — LITHIUM LEVEL: Lithium Lvl: <0.1 mmol/L — ABNORMAL LOW (ref 0.60–1.20)

## 2024-01-27 MED ORDER — DIPHENHYDRAMINE HCL 50 MG PO CAPS: 50.0000 mg | ORAL_CAPSULE | Freq: Three times a day (TID) | ORAL | Status: DC | PRN

## 2024-01-27 MED ORDER — WHITE PETROLATUM EX OINT
TOPICAL_OINTMENT | CUTANEOUS | Status: DC | PRN
  Administered 2024-01-27: 2 via TOPICAL
  Filled 2024-01-27: qty 10

## 2024-01-27 MED ORDER — ALUM & MAG HYDROXIDE-SIMETH 200-200-20 MG/5ML PO SUSP
30.0000 mL | ORAL | Status: DC | PRN
Start: 2024-01-27 — End: 2024-01-29

## 2024-01-27 MED ORDER — QUETIAPINE FUMARATE 100 MG PO TABS
100.0000 mg | ORAL_TABLET | Freq: Every day | ORAL | Status: DC
Start: 2024-01-28 — End: 2024-01-29
  Administered 2024-01-28 – 2024-01-29 (×2): 100 mg via ORAL
  Filled 2024-01-27 (×2): qty 1

## 2024-01-27 MED ORDER — ALBUTEROL SULFATE HFA 108 (90 BASE) MCG/ACT IN AERS: 1.0000 | INHALATION_SPRAY | RESPIRATORY_TRACT | Status: DC | PRN

## 2024-01-27 MED ORDER — ACETAMINOPHEN 325 MG PO TABS: 650.0000 mg | ORAL_TABLET | Freq: Four times a day (QID) | ORAL | Status: DC | PRN

## 2024-01-27 MED ORDER — LORAZEPAM 2 MG/ML IJ SOLN: 2.0000 mg | Freq: Three times a day (TID) | INTRAMUSCULAR | Status: DC | PRN

## 2024-01-27 MED ORDER — HALOPERIDOL 5 MG PO TABS: 5.0000 mg | ORAL_TABLET | Freq: Three times a day (TID) | ORAL | Status: DC | PRN

## 2024-01-27 MED ORDER — HALOPERIDOL LACTATE 5 MG/ML IJ SOLN: 5.0000 mg | Freq: Three times a day (TID) | INTRAMUSCULAR | Status: DC | PRN

## 2024-01-27 MED ORDER — QUETIAPINE FUMARATE 300 MG PO TABS
300.0000 mg | ORAL_TABLET | Freq: Every day | ORAL | Status: DC
  Administered 2024-01-28: 300 mg via ORAL
  Filled 2024-01-27 (×2): qty 1

## 2024-01-27 MED ORDER — DIPHENHYDRAMINE HCL 50 MG/ML IJ SOLN: 50.0000 mg | Freq: Three times a day (TID) | INTRAMUSCULAR | Status: DC | PRN

## 2024-01-27 MED ORDER — HYDROXYZINE HCL 25 MG PO TABS: 25.0000 mg | ORAL_TABLET | Freq: Three times a day (TID) | ORAL | Status: DC | PRN

## 2024-01-27 MED ORDER — QUETIAPINE FUMARATE 300 MG PO TABS: 300.0000 mg | ORAL_TABLET | Freq: Every day | ORAL | Status: AC

## 2024-01-27 MED ORDER — TRAZODONE HCL 100 MG PO TABS: 100.0000 mg | ORAL_TABLET | Freq: Every evening | ORAL | Status: DC | PRN

## 2024-01-27 MED ORDER — MAGNESIUM HYDROXIDE 400 MG/5ML PO SUSP: 30.0000 mL | Freq: Every day | ORAL | Status: DC | PRN

## 2024-01-27 NOTE — Discharge Instructions (Addendum)
[facility]

## 2024-01-27 NOTE — Group Note (Signed)
 Group Topic: Social Support  Group Date: 01/27/2024 Start Time: 1600 End Time: 1630 Facilitators: Zsofia Prout, Zane HERO, RN  Department: [facility]  Number of Participants: 1  Group Focus: check in Treatment Modality:  Individual Therapy Interventions utilized were support Purpose: express feelings  Name: Cheryl Baxter Date of Birth: Jul 10, 1990  MR: [id number]    Level of Participation: moderate Quality of Participation: attentive and cooperative Interactions with others: gave feedback Mood/Affect: appropriate Triggers (if applicable): None identified at this time Cognition: coherent/clear, insightful, and logical Progress: Gaining insight Response: Patient voiced no concerns regarding unit at this time. Support provided. Plan: patient will be encouraged to continue to attend groups and programming on the unit  Patients Problems:  Patient Active Problem List   Diagnosis Date Noted   Polysubstance abuse (HCC) 01/27/2024   Bipolar I disorder, current or most recent episode depressed, with psychotic features (HCC) 09/27/2023   Cocaine use disorder, mild, abuse (HCC) 09/27/2023   Chronic post-traumatic stress disorder (PTSD) 09/27/2023   Personal history of previous postdates pregnancy 08/08/2016   IUP (intrauterine pregnancy), incidental 08/07/2016

## 2024-01-27 NOTE — ED Notes (Signed)
 Pt observed lying in bed. Eyes closed respirations even and non labored. NAD q 15 minute observations continue for safety.

## 2024-01-27 NOTE — ED Notes (Signed)
 Paitent had lunch.

## 2024-01-27 NOTE — Progress Notes (Signed)
 Meal given

## 2024-01-27 NOTE — Care Management (Addendum)
 [facility] Care Management.Scientist, research (medical) met with patient..  Cheryl Baxter is a 32 y/o female mother of 6 kids, between the ages of 14 yo - 35 yo. She works at [employer]. Reported substances meth, cocaine, EtOH and occasional alcohol approximately three days ago. Cheryl Baxter lives in [place] @ [address] Patient stated has medicaid and is interested in [facility]   Writer will send secure chat to [facility]  3:20 pm Patient has appointment with [facility] on Monday 02/03/24 @ 8:30 am

## 2024-01-27 NOTE — ED Notes (Signed)
 Pt presents in recliner.   Pt denied SI HI and A/V hallucinations.   Pt reports fatigue at present as she has not slept well in a long time Irritable at times

## 2024-01-27 NOTE — ED Notes (Addendum)
 Patient transferred from [facility] to [facility] due to polysubstance abuse. Calm, cooperative throughout interview process. Skin assessment completed. Oriented to unit. Meal and drink offered. Patient alert & oriented x4. Denies intent to harm self or others when asked. Denies A/VH. Patient flat in appearance and affect, often answering questions prior to question being asked fully. Patient denies any physical complaints when asked. No acute distress noted. Support and encouragement provided. Routine safety checks conducted per facility protocol. Encouraged patient to notify staff if any thoughts of harm towards self or others arise. Patient verbalizes understanding and agreement.

## 2024-01-27 NOTE — ED Provider Notes (Addendum)
 FBC/OBS ASAP Discharge Summary  Date and Time: 01/27/2024 12:25 PM  Name: Cheryl Baxter  MRN:  [id number]   Discharge Diagnoses:  Final diagnoses:  Polysubstance abuse (HCC)  Suicidal ideation  Manic behavior (HCC)    Cheryl Baxter is a 33 y.o. female with a past psychiatric history of bipolar disorder, PTSD, insomnia, polysubstance abuse and ?schizophrenia who presented to the behavioral health urgent care center via [facility] to get help with substance treatment.  Subjective:   On assessment this morning, patient reports that she slept okay and appetite increase. Patient suspects is due to her substance use.  Patient reports that her drugs of abuse include cocaine and meth.  Patient reports that she used approximately 3 days ago, however does not quantify and appears to be minimizing.  Patient denies any significant withdrawal symptoms such as nausea, vomiting, muscle aches.  Patient denies any relationship straining or interference of job due to substance use.  Patient states that she would like to detox and consider outpatient treatment options.  Patient denies any prior history of rehab or treatment facility regarding substance use.  Patient does not currently attend any therapeutic services or Narcotics Anonymous groups to help with sobriety.  Regarding mood patient reports that it is up and down.  She reports noncompliance with Seroquel  due to finding about suspected liver damage with chronic use.  She denies suicidal ideations, homicidal ideations or auditory visual hallucinations today.  Patient reports that she did attempt to commit suicide in June and required a behavioral health hospitalization.  Patient reports that she follows up with [facility] for therapy and outpatient medication management, and feels comfortable discussing medications further with them.  Patient confirms that she has 6 children and they are currently at their grandmother's house.  She states  grandmother will be able to watch them while she detoxes and tries to address substance use issues.  Stay Summary:   Patient was brought to the [facility] behavioral health urgent care center for detox. Patient also at that time did have suicidal ideations with a plan to shoot herself and homicidal ideations to burn down been building a target with people in it.  She was admitted to the observation unit overnight.  Patient's UDS was positive for methamphetamines and amphetamines.  Patient did receive Zyprexa due to irritability/agitation.  Patient was restarted on her home dose of Seroquel  100 mg in the morning and 300 mg at night.  Lithium  was held and a lab level was obtained.  Upon reassessment, the patient was less irritable, agitated and was participatory throughout the discussion.  Patient reports that she wants to detox and have access to outpatient treatment for substances.  She denied SI/HI/AVH upon reassessment. To help address substance issues, patient was recommended to facility based crisis unit.  Patient was amenable and voluntarily was admitted.   Total Time spent with patient: 30 minutes  Per EMR or patient history  Past Psychiatric History:  bipolar disorder, PTSD, insomnia, polysubstance abuse, cocaine abuse, methamphetamine abuse and ?schizophrenia.  Prior Medication trials Abilify  10 mg daily, Lexapro  10 mg daily, paliperidone 3 mg daily, Benadryl  25 mg nightly for sleep.  Past Medical History: Asthma  Family History:  Medical : Mom-HTN, asthma, insomnia, father side-sickle cell, DM Psych: Insomnia-Mom, dad's sister-schizophrenia bipolar SA/HA: Denies Social History:  Patient moved from [place] Marital Status: single Children: 6 (11, 9, 5, 3, 2, 2 months) Employment: Unemployed Education: Completed high school and got to certificates from college.  Want to  work in Dealer or as a Energy manager. Housing: Lives with her 6 kids,  Guns: Denies Legal:  Denies   Tobacco Cessation:  Prescription not provided because: patient transferred to [facility]   Current Medications:  Current Facility-Administered Medications  Medication Dose Route Frequency Provider Last Rate Last Admin   acetaminophen  (TYLENOL ) tablet 650 mg  650 mg Oral Q6H PRN Trudy Carwin, NP   650 mg at 01/26/24 2140   alum & mag hydroxide-simeth (MAALOX/MYLANTA) 200-200-20 MG/5ML suspension 30 mL  30 mL Oral Q4H PRN Trudy Carwin, NP       haloperidol  (HALDOL ) tablet 5 mg  5 mg Oral TID PRN Trudy Carwin, NP       And   diphenhydrAMINE  (BENADRYL ) capsule 50 mg  50 mg Oral TID PRN Trudy Carwin, NP       haloperidol  lactate (HALDOL ) injection 5 mg  5 mg Intramuscular TID PRN Trudy Carwin, NP       And   diphenhydrAMINE  (BENADRYL ) injection 50 mg  50 mg Intramuscular TID PRN Trudy Carwin, NP       And   LORazepam  (ATIVAN ) injection 2 mg  2 mg Intramuscular TID PRN Trudy Carwin, NP       haloperidol  lactate (HALDOL ) injection 10 mg  10 mg Intramuscular TID PRN Trudy Carwin, NP       And   diphenhydrAMINE  (BENADRYL ) injection 50 mg  50 mg Intramuscular TID PRN Trudy Carwin, NP       And   LORazepam  (ATIVAN ) injection 2 mg  2 mg Intramuscular TID PRN Trudy Carwin, NP       magnesium  hydroxide (MILK OF MAGNESIA) suspension 30 mL  30 mL Oral Daily PRN Trudy Carwin, NP       QUEtiapine  (SEROQUEL ) tablet 100 mg  100 mg Oral Daily Trudy Carwin, NP   100 mg at 01/27/24 9091   QUEtiapine  (SEROQUEL ) tablet 300 mg  300 mg Oral QHS Trudy Carwin, NP   300 mg at 01/26/24 2141   traZODone  (DESYREL ) tablet 100 mg  100 mg Oral QHS PRN Trudy Carwin, NP   100 mg at 01/26/24 2140   Current Outpatient Medications  Medication Sig Dispense Refill   acetaminophen  (TYLENOL ) 325 MG tablet Take 650 mg by mouth every 6 (six) hours as needed (Pain).     albuterol  (VENTOLIN  HFA) 108 (90 Base) MCG/ACT inhaler Inhale 1 puff into the lungs every 4 (four) hours as needed for wheezing or shortness of  breath. 8.5 each 0   QUEtiapine  (SEROQUEL ) 100 MG tablet Take 1 tablet (100 mg total) by mouth daily. (Patient taking differently: Take 100 mg by mouth See admin instructions. Take 3 tablets (300 mg) PO in morning and 3 tablets (300 mg) PO QHS) 30 tablet 0   QUEtiapine  (SEROQUEL ) 300 MG tablet Take 1 tablet (300 mg total) by mouth at bedtime.      PTA Medications:  Facility Ordered Medications  Medication   acetaminophen  (TYLENOL ) tablet 650 mg   alum & mag hydroxide-simeth (MAALOX/MYLANTA) 200-200-20 MG/5ML suspension 30 mL   magnesium  hydroxide (MILK OF MAGNESIA) suspension 30 mL   haloperidol  (HALDOL ) tablet 5 mg   And   diphenhydrAMINE  (BENADRYL ) capsule 50 mg   haloperidol  lactate (HALDOL ) injection 5 mg   And   diphenhydrAMINE  (BENADRYL ) injection 50 mg   And   LORazepam  (ATIVAN ) injection 2 mg   haloperidol  lactate (HALDOL ) injection 10 mg   And   diphenhydrAMINE  (BENADRYL ) injection 50 mg  And   LORazepam  (ATIVAN ) injection 2 mg   traZODone  (DESYREL ) tablet 100 mg   [COMPLETED] OLANZapine (ZYPREXA) tablet 5 mg   QUEtiapine  (SEROQUEL ) tablet 100 mg   QUEtiapine  (SEROQUEL ) tablet 300 mg   PTA Medications  Medication Sig   acetaminophen  (TYLENOL ) 325 MG tablet Take 650 mg by mouth every 6 (six) hours as needed (Pain).   QUEtiapine  (SEROQUEL ) 100 MG tablet Take 1 tablet (100 mg total) by mouth daily. (Patient taking differently: Take 100 mg by mouth See admin instructions. Take 3 tablets (300 mg) PO in morning and 3 tablets (300 mg) PO QHS)   albuterol  (VENTOLIN  HFA) 108 (90 Base) MCG/ACT inhaler Inhale 1 puff into the lungs every 4 (four) hours as needed for wheezing or shortness of breath.   QUEtiapine  (SEROQUEL ) 300 MG tablet Take 1 tablet (300 mg total) by mouth at bedtime.        No data to display          Flowsheet Row ED from 01/26/2024 in [facility] (Discharged) [facility]  [facility] ED from 09/25/2023 in [facility] Emergency Department at [facility]  C-SSRS RISK CATEGORY No Risk High Risk High Risk    Musculoskeletal  Strength & Muscle Tone: within normal limits Gait & Station: normal Patient leans: N/A  Psychiatric Specialty Exam  Presentation  General Appearance:  Appropriate for Environment  Eye Contact: Fair  Speech: Clear and Coherent  Speech Volume: Normal  Handedness: Right   Mood and Affect  Mood: Anxious; Euthymic; Labile  Affect: Blunt   Thought Process  Thought Processes: Coherent  Descriptions of Associations:Intact  Orientation:Full (Time, Place and Person)  Thought Content:WDL  Diagnosis of Schizophrenia or Schizoaffective disorder in past: No  Duration of Psychotic Symptoms: Greater than six months   Hallucinations:Hallucinations: None  Ideas of Reference:None  Suicidal Thoughts:Suicidal Thoughts: No  Homicidal Thoughts:Homicidal Thoughts: No   Sensorium  Memory: Immediate Fair  Judgment: Poor  Insight: Poor   Executive Functions  Concentration: Fair  Attention Span: Fair  Recall: Fair  Fund of Knowledge: Fair  Language: Fair   Psychomotor Activity  Psychomotor Activity: Psychomotor Activity: Normal   Assets  Assets: Desire for Improvement; Housing   Sleep  Sleep: Sleep: Fair  Estimated Sleeping Duration (Last 24 Hours): 9.50-10.25 hours  Nutritional Assessment (For OBS and FBC admissions only) Has the patient had a weight loss or gain of 10 pounds or more in the last 3 months?: No Has the patient had a decrease in food intake/or appetite?: No Does the patient have dental problems?: No Does the patient have eating habits or behaviors that may be indicators of an eating disorder including binging or inducing vomiting?: No Has the patient recently lost weight without trying?: 0 Has the patient been eating poorly because of a decreased appetite?: 0 Malnutrition  Screening Tool Score: 0    Physical Exam  Physical Exam Constitutional:      Appearance: She is obese.  Pulmonary:     Effort: Pulmonary effort is normal.  Musculoskeletal:        General: Normal range of motion.  Neurological:     Mental Status: She is alert.    Review of Systems  Constitutional:  Negative for chills and fever.  Gastrointestinal:  Negative for nausea.  Musculoskeletal:  Negative for myalgias.  Neurological:  Negative for headaches.  Psychiatric/Behavioral:  Positive for depression. Negative for hallucinations and suicidal ideas. The patient  is nervous/anxious. The patient does not have insomnia.    Blood pressure 99/69, pulse 84, temperature 98.6 F (37 C), temperature source Oral, resp. rate 17, SpO2 99%, unknown if currently breastfeeding. There is no height or weight on file to calculate BMI.  Demographic Factors:  Low socioeconomic status  Loss Factors: NA  Historical Factors: Prior suicide attempts, Family history of mental illness or substance abuse, and Impulsivity  Risk Reduction Factors:   Responsible for children under 24 years of age, Sense of responsibility to family, Employed, and Living with another person, especially a relative  Continued Clinical Symptoms:  Alcohol/Substance Abuse/Dependencies More than one psychiatric diagnosis Unstable or Poor Therapeutic Relationship Previous Psychiatric Diagnoses and Treatments  Cognitive Features That Contribute To Risk:  None    Suicide Risk:  Moderate:  Frequent suicidal ideation with limited intensity, and duration, some specificity in terms of plans, no associated intent, good self-control, limited dysphoria/symptomatology, some risk factors present, and identifiable protective factors, including available and accessible social support.  Plan Of Care/Follow-up recommendations:  Inpatient treatment for detox   Disposition: Facility Based Crisis Unit   PATTI OLDEN, MD 01/27/2024,  12:25 PM

## 2024-01-27 NOTE — ED Provider Notes (Addendum)
 Facility Based Crisis Admission H&P  Date: 01/27/24 [name] MRN: [id number] Chief Complaint:  I want help with substances   Diagnoses:  Final diagnoses:  Polysubstance abuse (HCC)  Suicidal ideation  Manic behavior (HCC)    HPI:   Cheryl Baxter is a 33 y.o. female with a past psychiatric history of bipolar disorder, PTSD, insomnia, polysubstance abuse and ?schizophrenia who presented to the behavioral health urgent care center via [facility] Department to get help with substance treatment.   On assessment this morning, patient reports that she slept okay and appetite increase. Patient suspects is due to her substance use.  Patient reports that her drugs of abuse include cocaine, meth and EtOH.  Patient reports that she used approximately 3 days ago, however does not quantify and appears to be minimizing.  Patient reports that her last alcoholic drink was 3 days ago, with approximately 3 cups of liquor.  Patient reports that she drinks occasionally for birthdays and holidays, and at that time can ingest at least 6 cups of mixed drinks.  Patient denies any significant withdrawal symptoms such as nausea, vomiting, muscle aches, no prior history of seizures, auditory hallucinations or DTs..  Patient denies any relationship straining or interference of job due to substance use.  Patient states that she would like to detox and consider outpatient treatment options.  Patient denies any prior history of rehab or treatment facility regarding substance use.  Patient does not currently attend any therapeutic services or Narcotics Anonymous groups to help with sobriety.   Regarding mood patient reports that it is up and down.  She reports noncompliance with Seroquel  due to finding about suspected liver damage with chronic use.  She denies suicidal ideations, homicidal ideations or auditory visual hallucinations today.  Patient reports that she did attempt to commit suicide in June  and required a behavioral health hospitalization.  Patient reports that she follows up with [facility] for therapy and outpatient medication management, and feels comfortable discussing medications further with them.  Patient confirms that she has 6 children and they are currently at their grandmother's house.  She states grandmother will be able to watch them while she detoxes and tries to address substance use issues.   PHQ 2-9:   Flowsheet Row ED from 01/26/2024 in [facility] Admission (Discharged) [facility] from 09/25/2023 in [facility] at [facility]  C-SSRS RISK CATEGORY No Risk High Risk High Risk     Total Time spent with patient: 30 minutes  Musculoskeletal  Strength & Muscle Tone: within normal limits Gait & Station: normal Patient leans: N/A  Psychiatric Specialty Exam  Presentation General Appearance:  Appropriate for Environment  Eye Contact: Fair  Speech: Clear and Coherent  Speech Volume: Normal  Handedness: Right   Mood and Affect  Mood: Anxious; Euthymic; Labile  Affect: Blunt   Thought Process  Thought Processes: Coherent  Descriptions of Associations:Intact  Orientation:Full (Time, Place and Person)  Thought Content:WDL  Diagnosis of Schizophrenia or Schizoaffective disorder in past: No  Duration of Psychotic Symptoms: Greater than six months  Hallucinations:Hallucinations: None  Ideas of Reference:None  Suicidal Thoughts:Suicidal Thoughts: No  Homicidal Thoughts:Homicidal Thoughts: No   Sensorium  Memory: Immediate Fair  Judgment: Poor  Insight: Poor   Executive Functions  Concentration: Fair  Attention Span: Fair  Recall: Fair  Fund of Knowledge: Fair  Language: Fair   Psychomotor Activity  Psychomotor Activity: Psychomotor  Activity: Normal   Assets  Assets: Desire for Improvement;  Housing   Sleep  Sleep: Sleep: Fair Number of Hours of Sleep: 8   Nutritional Assessment (For OBS and FBC admissions only) Has the patient had a weight loss or gain of 10 pounds or more in the last 3 months?: No Has the patient had a decrease in food intake/or appetite?: No Does the patient have dental problems?: No Does the patient have eating habits or behaviors that may be indicators of an eating disorder including binging or inducing vomiting?: No Has the patient recently lost weight without trying?: 0 Has the patient been eating poorly because of a decreased appetite?: 0 Malnutrition Screening Tool Score: 0    Physical Exam Constitutional:      Appearance: She is obese.  Pulmonary:     Effort: Pulmonary effort is normal.  Musculoskeletal:        General: Normal range of motion.  Neurological:     Mental Status: She is alert.     Review of Systems  Constitutional:  Negative for chills and fever.  Gastrointestinal:  Negative for nausea.  Musculoskeletal:  Negative for myalgias.  Neurological:  Negative for headaches.  Psychiatric/Behavioral:  Positive for depression. Negative for hallucinations and suicidal ideas. The patient is nervous/anxious. The patient does not have insomnia.    Blood pressure 99/69, pulse 84, temperature 98.6 F (37 C), temperature source Oral, resp. rate 17, SpO2 99%, unknown if currently breastfeeding. There is no height or weight on file to calculate BMI.  Past Psychiatric History:  Per EMR or patient history  Past Psychiatric History:  bipolar disorder, PTSD, insomnia, polysubstance abuse, cocaine abuse, methamphetamine abuse and ?schizophrenia.  Prior Medication trials Abilify  10 mg daily, Lexapro  10 mg daily, paliperidone 3 mg daily, Benadryl  25 mg nightly for sleep.  Past Medical History: Asthma  Family History:  Medical : Mom-HTN, asthma, insomnia, father side-sickle cell, DM Psych: Insomnia-Mom, dad's sister-schizophrenia  bipolar SA/HA: Denies Social History:  Patient moved from [place] Marital Status: single Children: 6 (11, 9, 5, 3, 2, 2 months) Employment: Unemployed Education: Completed high school and got to certificates from college.  Want to work in Dealer or as a Energy manager. Housing: Lives with her 6 kids,  Guns: Denies Legal: Denies    Is the patient at risk to self? Yes  Has the patient been a risk to self in the past 6 months? Yes .    Has the patient been a risk to self within the distant past? Yes   Is the patient a risk to others? No   Has the patient been a risk to others in the past 6 months? No   Has the patient been a risk to others within the distant past? Yes   Last Labs:  Admission on 01/26/2024  Component Date Value Ref Range Status   WBC 01/26/2024 9.1  4.0 - 10.5 K/uL Final   RBC 01/26/2024 5.15 (H)  3.87 - 5.11 MIL/uL Final   Hemoglobin 01/26/2024 14.0  12.0 - 15.0 g/dL Final   HCT 89/86/7974 41.7  36.0 - 46.0 % Final   MCV 01/26/2024 81.0  80.0 - 100.0 fL Final   MCH 01/26/2024 27.2  26.0 - 34.0 pg Final   MCHC 01/26/2024 33.6  30.0 - 36.0 g/dL Final   RDW 89/86/7974 14.4  11.5 - 15.5 % Final   Platelets 01/26/2024 317  150 - 400 K/uL Final   nRBC 01/26/2024 0.0  0.0 -  0.2 % Final   Neutrophils Relative % 01/26/2024 60  % Final   Neutro Abs 01/26/2024 5.5  1.7 - 7.7 K/uL Final   Lymphocytes Relative 01/26/2024 31  % Final   Lymphs Abs 01/26/2024 2.8  0.7 - 4.0 K/uL Final   Monocytes Relative 01/26/2024 6  % Final   Monocytes Absolute 01/26/2024 0.6  0.1 - 1.0 K/uL Final   Eosinophils Relative 01/26/2024 2  % Final   Eosinophils Absolute 01/26/2024 0.2  0.0 - 0.5 K/uL Final   Basophils Relative 01/26/2024 1  % Final   Basophils Absolute 01/26/2024 0.1  0.0 - 0.1 K/uL Final   Immature Granulocytes 01/26/2024 0  % Final   Abs Immature Granulocytes 01/26/2024 0.02  0.00 - 0.07 K/uL Final   Performed at [facility], [address],  [place], [place] [address]   Sodium 01/26/2024 139  135 - 145 mmol/L Final   Potassium 01/26/2024 3.9  3.5 - 5.1 mmol/L Final   Chloride 01/26/2024 103  98 - 111 mmol/L Final   CO2 01/26/2024 24  22 - 32 mmol/L Final   Glucose, Bld 01/26/2024 85  70 - 99 mg/dL Final   Glucose reference range applies only to samples taken after fasting for at least 8 hours.   BUN 01/26/2024 8  6 - 20 mg/dL Final   Creatinine, Ser 01/26/2024 0.84  0.44 - 1.00 mg/dL Final   Calcium 89/86/7974 8.9  8.9 - 10.3 mg/dL Final   Total Protein 89/86/7974 7.0  6.5 - 8.1 g/dL Final   Albumin 89/86/7974 3.9  3.5 - 5.0 g/dL Final   AST 89/86/7974 20  15 - 41 U/L Final   ALT 01/26/2024 15  0 - 44 U/L Final   Alkaline Phosphatase 01/26/2024 84  38 - 126 U/L Final   Total Bilirubin 01/26/2024 0.4  0.0 - 1.2 mg/dL Final   GFR, Estimated 01/26/2024 >60  >60 mL/min Final   Comment: (NOTE) Calculated using the CKD-EPI Creatinine Equation (2021)    Anion gap 01/26/2024 12  5 - 15 Final   Performed at [facility], [address] [place], [place] [address]   Alcohol, Ethyl (B) 01/26/2024 <15  <15 mg/dL Final   Comment: (NOTE) For medical purposes only. Performed at [facility], [address], [place], [place] [address]    TSH 01/26/2024 0.553  0.350 - 4.500 uIU/mL Final   Comment: Performed by a 3rd Generation assay with a functional sensitivity of <=0.01 uIU/mL. Performed at [facility], [address], [place], [place] [address]    Preg Test, Ur 01/26/2024 Negative  Negative Final   POC Amphetamine UR 01/26/2024 Positive (A)  NONE DETECTED (Cut Off Level 1000 ng/mL) Final   POC Secobarbital (BAR) 01/26/2024 None Detected  NONE DETECTED (Cut Off Level 300 ng/mL) Final   POC Buprenorphine (BUP) 01/26/2024 None Detected  NONE DETECTED (Cut Off Level 10 ng/mL) Final   POC Oxazepam (BZO) 01/26/2024 None Detected  NONE DETECTED (Cut Off Level 300 ng/mL) Final   POC Cocaine UR 01/26/2024 None Detected  NONE  DETECTED (Cut Off Level 300 ng/mL) Final   POC Methamphetamine UR 01/26/2024 Positive (A)  NONE DETECTED (Cut Off Level 1000 ng/mL) Final   POC Morphine 01/26/2024 None Detected  NONE DETECTED (Cut Off Level 300 ng/mL) Final   POC Methadone UR 01/26/2024 None Detected  NONE DETECTED (Cut Off Level 300 ng/mL) Final   POC Oxycodone  UR 01/26/2024 None Detected  NONE DETECTED (Cut Off Level 100 ng/mL) Final   POC Marijuana UR 01/26/2024 None Detected  NONE DETECTED (Cut Off Level 50 ng/mL) Final  Admission on 09/26/2023, Discharged on 10/04/2023  Component Date Value Ref Range Status   TSH 09/28/2023 1.715  0.350 - 4.500 uIU/mL Final   Comment: Performed by a 3rd Generation assay with a functional sensitivity of <=0.01 uIU/mL. Performed at [facility], [address], [place], [place] [address]    Hgb A1c MFr Bld 09/28/2023 4.9  4.8 - 5.6 % Final   Comment: (NOTE) Diagnosis of Diabetes The following HbA1c ranges recommended by the American Diabetes Association (ADA) may be used as an aid in the diagnosis of diabetes mellitus.  Hemoglobin             Suggested A1C NGSP%              Diagnosis  <5.7                   Non Diabetic  5.7-6.4                Pre-Diabetic  >6.4                   Diabetic  <7.0                   Glycemic control for                       adults with diabetes.     Mean Plasma Glucose 09/28/2023 93.93  mg/dL Final   Performed at [facility], [address], [place], [place] [address]   Cholesterol 09/28/2023 199  0 - 200 mg/dL Final   Triglycerides 93/84/7974 83  <150 mg/dL Final   HDL 93/84/7974 71  >40 mg/dL Final   Total CHOL/HDL Ratio 09/28/2023 2.8  RATIO Final   VLDL 09/28/2023 17  0 - 40 mg/dL Final   LDL Cholesterol 09/28/2023 111 (H)  0 - 99 mg/dL Final   Comment:        Total Cholesterol/HDL:CHD Risk Coronary Heart Disease Risk Table                     Men   Women  1/2 Average Risk   3.4   3.3  Average Risk        5.0   4.4  2 X Average Risk   9.6   7.1  3 X Average Risk  23.4   11.0        Use the calculated Patient Ratio above and the CHD Risk Table to determine the patient's CHD Risk.        ATP III CLASSIFICATION (LDL):  <100     mg/dL   Optimal  899-870  mg/dL   Near or Above                    Optimal  130-159  mg/dL   Borderline  839-810  mg/dL   High  >809     mg/dL   Very High Performed at [facility], [address], [place], [place] [address]    Lithium  Lvl 10/04/2023 0.33 (L)  0.60 - 1.20 mmol/L Final   Performed at [facility], [address], [place], [place] [address]   TSH 10/04/2023 2.666  0.350 - 4.500 uIU/mL Final   Comment: Performed by a 3rd  Generation assay with a functional sensitivity of <=0.01 uIU/mL. Performed at [facility], [address] [place], [place] [address]    Sodium 10/04/2023 135  135 - 145 mmol/L Final   Potassium 10/04/2023 4.3  3.5 - 5.1 mmol/L Final   Chloride 10/04/2023 99  98 - 111 mmol/L Final   CO2 10/04/2023 23  22 - 32 mmol/L Final   Glucose, Bld 10/04/2023 86  70 - 99 mg/dL Final   Glucose reference range applies only to samples taken after fasting for at least 8 hours.   BUN 10/04/2023 13  6 - 20 mg/dL Final   Creatinine, Ser 10/04/2023 0.74  0.44 - 1.00 mg/dL Final   Calcium 93/78/7974 9.4  8.9 - 10.3 mg/dL Final   GFR, Estimated 10/04/2023 >60  >60 mL/min Final   Comment: (NOTE) Calculated using the CKD-EPI Creatinine Equation (2021)    Anion gap 10/04/2023 13  5 - 15 Final   Performed at [facility], [address] [place], [place] [address]  Admission on 09/25/2023, Discharged on 09/26/2023  Component Date Value Ref Range Status   Magnesium  09/25/2023 2.1  1.7 - 2.4 mg/dL Final   Performed at [facility], [address] [place], [place] [address]   Sodium 09/25/2023 141  135 - 145 mmol/L Final   Potassium 09/25/2023 3.1 (L)  3.5 - 5.1 mmol/L  Final   Chloride 09/25/2023 108  98 - 111 mmol/L Final   CO2 09/25/2023 23  22 - 32 mmol/L Final   Glucose, Bld 09/25/2023 86  70 - 99 mg/dL Final   Glucose reference range applies only to samples taken after fasting for at least 8 hours.   BUN 09/25/2023 11  6 - 20 mg/dL Final   Creatinine, Ser 09/25/2023 0.91  0.44 - 1.00 mg/dL Final   Calcium 93/87/7974 8.9  8.9 - 10.3 mg/dL Final   Total Protein 93/87/7974 7.9  6.5 - 8.1 g/dL Final   Albumin 93/87/7974 3.9  3.5 - 5.0 g/dL Final   AST 93/87/7974 28  15 - 41 U/L Final   ALT 09/25/2023 25  0 - 44 U/L Final   Alkaline Phosphatase 09/25/2023 64  38 - 126 U/L Final   Total Bilirubin 09/25/2023 0.6  0.0 - 1.2 mg/dL Final   GFR, Estimated 09/25/2023 >60  >60 mL/min Final   Comment: (NOTE) Calculated using the CKD-EPI Creatinine Equation (2021)    Anion gap 09/25/2023 10  5 - 15 Final   Performed at [facility], [address] [place], [place] [address]   Alcohol, Ethyl (B) 09/25/2023 <15  <15 mg/dL Final   Comment: (NOTE) For medical purposes only. Performed at [facility], [address] [place], [place] [address]    Opiates 09/25/2023 NONE DETECTED  NONE DETECTED Final   Cocaine 09/25/2023 POSITIVE (A)  NONE DETECTED Final   Benzodiazepines 09/25/2023 POSITIVE (A)  NONE DETECTED Final   Amphetamines 09/25/2023 NONE DETECTED  NONE DETECTED Final   Tetrahydrocannabinol 09/25/2023 POSITIVE (A)  NONE DETECTED Final   Barbiturates 09/25/2023 NONE DETECTED  NONE DETECTED Final   Comment: (NOTE) DRUG SCREEN FOR MEDICAL PURPOSES ONLY.  IF CONFIRMATION IS NEEDED FOR ANY PURPOSE, NOTIFY LAB WITHIN 5 DAYS.  LOWEST DETECTABLE LIMITS FOR URINE DRUG SCREEN Drug Class                     Cutoff (ng/mL) Amphetamine and metabolites    1000 Barbiturate  and metabolites    200 Benzodiazepine                 200 Opiates and metabolites        300 Cocaine and metabolites        300 THC                             50 Performed at [facility], [address], [place], [place] [address]    Preg, Serum 09/25/2023 NEGATIVE  NEGATIVE Final   Comment:        THE SENSITIVITY OF THIS METHODOLOGY IS >10 mIU/mL. Performed at [facility], [address] [place], [place] [address]    WBC 09/25/2023 8.9  4.0 - 10.5 K/uL Final   RBC 09/25/2023 4.49  3.87 - 5.11 MIL/uL Final   Hemoglobin 09/25/2023 12.3  12.0 - 15.0 g/dL Final   HCT 93/87/7974 38.0  36.0 - 46.0 % Final   MCV 09/25/2023 84.6  80.0 - 100.0 fL Final   MCH 09/25/2023 27.4  26.0 - 34.0 pg Final   MCHC 09/25/2023 32.4  30.0 - 36.0 g/dL Final   RDW 93/87/7974 13.8  11.5 - 15.5 % Final   Platelets 09/25/2023 284  150 - 400 K/uL Final   nRBC 09/25/2023 0.0  0.0 - 0.2 % Final   Performed at [facility], [address], [place], [place] [address]   Color, Urine 09/25/2023 STRAW (A)  YELLOW Final   APPearance 09/25/2023 CLEAR  CLEAR Final   Specific Gravity, Urine 09/25/2023 1.006  1.005 - 1.030 Final   pH 09/25/2023 6.0  5.0 - 8.0 Final   Glucose, UA 09/25/2023 NEGATIVE  NEGATIVE mg/dL Final   Hgb urine dipstick 09/25/2023 NEGATIVE  NEGATIVE Final   Bilirubin Urine 09/25/2023 NEGATIVE  NEGATIVE Final   Ketones, ur 09/25/2023 NEGATIVE  NEGATIVE mg/dL Final   Protein, ur 93/87/7974 NEGATIVE  NEGATIVE mg/dL Final   Nitrite 93/87/7974 NEGATIVE  NEGATIVE Final   Leukocytes,Ua 09/25/2023 NEGATIVE  NEGATIVE Final   Performed at [facility], [address] [place], [place] [address]    Allergies: Penicillins  Medications:  Facility Ordered Medications  Medication   acetaminophen  (TYLENOL ) tablet 650 mg   alum & mag hydroxide-simeth (MAALOX/MYLANTA) 200-200-20 MG/5ML suspension 30 mL   magnesium  hydroxide (MILK OF MAGNESIA) suspension 30 mL   haloperidol  (HALDOL ) tablet 5 mg   And   diphenhydrAMINE  (BENADRYL ) capsule 50 mg   haloperidol  lactate (HALDOL )  injection 5 mg   And   diphenhydrAMINE  (BENADRYL ) injection 50 mg   And   LORazepam  (ATIVAN ) injection 2 mg   haloperidol  lactate (HALDOL ) injection 10 mg   And   diphenhydrAMINE  (BENADRYL ) injection 50 mg   And   LORazepam  (ATIVAN ) injection 2 mg   traZODone  (DESYREL ) tablet 100 mg   [COMPLETED] OLANZapine (ZYPREXA) tablet 5 mg   QUEtiapine  (SEROQUEL ) tablet 100 mg   QUEtiapine  (SEROQUEL ) tablet 300 mg   PTA Medications  Medication Sig   acetaminophen  (TYLENOL ) 325 MG tablet Take 650 mg by mouth every 6 (six) hours as needed (Pain).   QUEtiapine  (SEROQUEL ) 100 MG tablet Take 1 tablet (100 mg total) by mouth daily. (Patient taking differently: Take 100 mg by mouth See admin instructions. Take 3 tablets (300 mg) PO in morning and 3 tablets (300 mg) PO QHS)   albuterol  (VENTOLIN  HFA) 108 (90 Base) MCG/ACT inhaler Inhale  1 puff into the lungs every 4 (four) hours as needed for wheezing or shortness of breath.   QUEtiapine  (SEROQUEL ) 300 MG tablet Take 1 tablet (300 mg total) by mouth at bedtime.    Long Term Goals: Improvement in symptoms so as ready for discharge  Short Term Goals: Patient will verbalize feelings in meetings with treatment team members. and Patient will take medications as prescribed daily.  Medical Decision Making  Katha Kuehne is a 33 y.o. female with a past psychiatric history of bipolar disorder, PTSD, insomnia, polysubstance abuse and ?schizophrenia who presented to the behavioral health urgent care center via [facility] Department to get help with substance treatment.   On assessment, the patient does report that she wants assistance with detox and substance treatment.  Given patient's chronic history and lack of previous stents and rehab or treatment facilities, she desires assistance.  Will admit her to the facility with crisis unit to discuss further outpatient treatment options with social work..  Patient does not appear to be acutely withdrawing  currently, and given last substance ingestion was 3 days ago with meth and alcohol we will continue to offer supportive treatment.  Will not initiate CIWA protocol given no prior history of DTs and patient's ethanol level less than 15 on admission.  Given patient's prior history of bipolar disorder we will restart her regimen of Seroquel  and will obtain a lithium  lab level.  Patient has been intermittently irritable, prior to admission on the behavioral health observation unit yesterday, and on assessment this morning was cooperative and redirectable. Will assess lithium  level to see if in therapeutic range.  Suspect range will be subtherapeutic given patient's reported history of noncompliance. Patient followed by [facility] for outpatient med management and therapy.     Recommendations  Based on my evaluation the patient does not appear to have an emergency medical condition. -- Continue Seroquel  100 mg daily and 300 mg at bedtime --Follow-up lithium  lab levels, holding lithium  prescription currently --As needed agitation protocol ordered and other supportive medications available  Billy Turvey, MD 01/27/24  12:25 PM

## 2024-01-27 NOTE — Group Note (Signed)
 Group Topic: Healthy Self Image and Positive Change  Group Date: 01/27/2024 Start Time: 2000 End Time: 2030 Facilitators: Anice Benton LABOR, NT  Department: [facility]  Number of Participants: 4  Group Focus: communication, feeling awareness/expression, and relapse prevention Treatment Modality:  Solution-Focused Therapy Interventions utilized were group exercise Purpose: increase insight, express feelings   Name: Etana Beets Date of Birth: [birth year]  MR: [id number]    Level of Participation: Did Not Attend  Quality of Participation: N/A Interactions with others: N/A Mood/Affect: N/A Triggers (if applicable): N/A Cognition: N/A Progress: None Response: N/A Plan: patient will be encouraged to attend groups  Patients Problems:  Patient Active Problem List   Diagnosis Date Noted   Polysubstance abuse (HCC) 01/27/2024   Bipolar I disorder, current or most recent episode depressed, with psychotic features (HCC) 09/27/2023   Cocaine use disorder, mild, abuse (HCC) 09/27/2023   Chronic post-traumatic stress disorder (PTSD) 09/27/2023   Personal history of previous postdates pregnancy 08/08/2016   IUP (intrauterine pregnancy), incidental 08/07/2016

## 2024-01-28 DIAGNOSIS — R45851 Suicidal ideations: Secondary | ICD-10-CM | POA: Diagnosis not present

## 2024-01-28 DIAGNOSIS — F1514 Other stimulant abuse with stimulant-induced mood disorder: Secondary | ICD-10-CM | POA: Diagnosis not present

## 2024-01-28 NOTE — ED Notes (Signed)
 Pt complains of being hungry again. Snacks has been provided again.

## 2024-01-28 NOTE — Group Note (Addendum)
 Group Topic: Wellness  Group Date: 01/28/2024 Start Time: 1200 End Time: 1230 Facilitators: Daved Tinnie HERO, RN  Department: [facility]  Number of Participants: 6 Group Focus: psychiatric education Treatment Modality:  Psychoeducation Interventions utilized were patient education Purpose: increase insight  Name: Cheryl Baxter Date of Birth: 03/17/1991  MR: [id number]    Level of Participation: moderate Quality of Participation: cooperative Interactions with others: gave feedback Mood/Affect: appropriate Triggers (if applicable): n/a Cognition: coherent/clear Progress: Gaining insight Response: RN discussed medications, pt affirmed understanding, denied further questions Plan: patient will be encouraged to attend future RN education groups  Patients Problems:  Patient Active Problem List   Diagnosis Date Noted   Polysubstance abuse (HCC) 01/27/2024   Bipolar I disorder, current or most recent episode depressed, with psychotic features (HCC) 09/27/2023   Cocaine use disorder, mild, abuse (HCC) 09/27/2023   Chronic post-traumatic stress disorder (PTSD) 09/27/2023   Personal history of previous postdates pregnancy 08/08/2016   IUP (intrauterine pregnancy), incidental 08/07/2016

## 2024-01-28 NOTE — Group Note (Signed)
 Group Topic: Social Support  Group Date: 01/28/2024 Start Time: 2000 End Time: 2030 Facilitators: Joan Plowman B  Department: [facility]  Number of Participants: 3  Group Focus: abuse issues, activities of daily living skills, coping skills, and daily focus Treatment Modality:  Leisure Counsellor and Patient-Centered Therapy Interventions utilized were leisure development, patient education, and support Purpose: express feelings, increase insight, and relapse prevention strategies  Name: Cheryl Baxter Date of Birth: 03-22-1991  MR: [id number]    Level of Participation: PT DID NOT ATTEND GROUPS  Patients Problems:  Patient Active Problem List   Diagnosis Date Noted   Polysubstance abuse (HCC) 01/27/2024   Bipolar I disorder, current or most recent episode depressed, with psychotic features (HCC) 09/27/2023   Cocaine use disorder, mild, abuse (HCC) 09/27/2023   Chronic post-traumatic stress disorder (PTSD) 09/27/2023   Personal history of previous postdates pregnancy 08/08/2016   IUP (intrauterine pregnancy), incidental 08/07/2016

## 2024-01-28 NOTE — Group Note (Signed)
 Group Topic: Balance in Life  Group Date: 01/28/2024 Start Time: 1300 End Time: 1345 Facilitators: Alyse Leilani LABOR, NT  Department: [facility]  Number of Participants: 5  Group Focus: activities of daily living skills and affirmation Treatment Modality:  Individual Therapy Interventions utilized were story telling Purpose: enhance coping skills and express feelings  Name: Cheryl Baxter Date of Birth: May 02, 1990  MR: [id number]   Pt did not attend group Patients Problems:  Patient Active Problem List   Diagnosis Date Noted   Polysubstance abuse (HCC) 01/27/2024   Bipolar I disorder, current or most recent episode depressed, with psychotic features (HCC) 09/27/2023   Cocaine use disorder, mild, abuse (HCC) 09/27/2023   Chronic post-traumatic stress disorder (PTSD) 09/27/2023   Personal history of previous postdates pregnancy 08/08/2016   IUP (intrauterine pregnancy), incidental 08/07/2016

## 2024-01-28 NOTE — ED Notes (Signed)
 Pt is sleeping at the moment. No acute distress noted. Respirations are even and labored. Q15 safety checks in place.

## 2024-01-28 NOTE — Progress Notes (Signed)
 Pt has been accepted to [facility] on 01/28/2024 Bed assignment: EMERSON A UNIT  Pt meets inpatient criteria per: Donia Snell NP  Attending Physician will be: Dr. Ilsa   Report can be called to: [phone] or [phone]  Pt can arrive after IVC has been completed  Care Team Notified: Donia Snell NP  [place] Luanna Weesner LCSW-A   01/28/2024 2:34 PM

## 2024-01-28 NOTE — ED Notes (Signed)
 Pt came to the nursing station, agitated requesting for discharge. Writer explained to pt, we do not discharge in the night and got pt to sign the 72 hour request for discharge form.

## 2024-01-28 NOTE — Care Management (Signed)
 [facility] Care Management   Per [facility] [place], patient has been accepted to [facility].  Writer informed the RN and she will arrange transportation for the patient

## 2024-01-28 NOTE — ED Notes (Signed)
 Pt is sleeping at the moment. No acute distress noted. Q15 safety checks in place.

## 2024-01-28 NOTE — ED Notes (Signed)
 Pt did not attend AA she was sleeping

## 2024-01-28 NOTE — Discharge Instructions (Addendum)
 Transfer patient to [facility] for treatment and stabilization of mental status.

## 2024-01-28 NOTE — ED Provider Notes (Signed)
 Patient is psychotic and has unpredictable behavior. She threatened to blow up a target. One minute she is fine and the next she is yelling. She is agitated and unhappy with being here. She is incoherent and unorganized. Wrote an IVC for involuntary admission for [facility] to stabilize at a higher level of care due to patient unable to maintain cooperation reliably with transportation to facility and needing further treatment. She has lack of insight and is paranoid. Quotes from her are: Why specifically I haven't been going to work. This job thing I tried to be in a position like this hospital. My family weaponize me, hurt me better than helping me. If I don't have my children. My kids are the best thing. I have been seeing in a worse condition. They do it more on a daily basis. They love the thought of what I can do for them. On admission there was a report that she threatened to blow up a [employer]. IVC written for mental illness and substance use problems. 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

## 2024-01-28 NOTE — ED Notes (Signed)
 Pt did attend the peer support group.

## 2024-01-28 NOTE — Progress Notes (Signed)
 LCSW Progress Note  [id number]   Cheryl Baxter  01/28/2024  1:04 PM  Description:   Inpatient Psychiatric Referral  Patient was recommended inpatient per Donia Snell (NP). There are no available beds at [facility], per [facility] Carlo RN). Patient was referred to the following out of network facilities  :Destination  Service Provider Address Phone Fax  [facility]  [address] [place] [address] [phone]  [facility]  [address] [place] [address] [phone]  [facility]  [address] [place] [address] [phone]  [facility] EFAX  [address] [place] [phone]  [facility]  [address] [place] [address] [phone]       Situation ongoing, CSW to continue following and update chart as more information becomes available.      Cheryl Baxter , MSW, LCSW  01/28/2024 1:04 PM

## 2024-01-28 NOTE — ED Notes (Signed)
 Pt is sleeping currently, no acute distress noted.

## 2024-01-28 NOTE — ED Notes (Signed)
 Pt observed and assess in her room. During assessment, pt is irritable, denies SI/HI/AVH at the moment. Complains of feeling hungry even after having diner. Snacks and juice has been provided to pt. Pt is still irritable and asking for more food even though she is not done with what was given earlier. Q15 safety check is still in place.

## 2024-01-28 NOTE — ED Provider Notes (Addendum)
 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  Presented to [facility], via [facility].  Per the patient she is here to get help with meth use, she reports last used meth 3 days ago she also reports passive suicidal thoughts with plans to shoot herself patient also endorsed during triage that she had plans to burn down a building in [employer] people.  It does seem to be in a state of mania.   Stay Summary & Patient assessment prior to transfer to [facility]: On assessment today, patient is disorganized, circumstantial in her responses to questions, she is paranoid, is irritable, standing at the nurses station yelling out profanities at staff, disruptive to the milieu, is irrational, exhibiting poor judgment, is inappropriate, repeatedly stating: I am mad as fuck. I haven't fucked in a year. My parents is using my kids to weaponize me. Patient is difficult to redirect at time, accusing staff of doing things to her, but does not substantiate on what is being done to her. She currently denies suicidal ideations, and when asked questions related to homicidal ideations, she smirks suspiciously, and refuses to answer questions.  Patient is asked questions relating to psychosis, states yes, when asked about auditory hallucinations, but when asked to elaborate on what she is hearing, she refuses to do so.  She denies visual hallucinations.  She reports poor sleep quality last night, reports a good appetite, shares that prior to hospitalization, she had stopped taking her Seroquel  because she googled the Seroquel , and found that over time it would damage her liver.  We talked about the  potential for weight gain with this medication, and writer educated on other medications with a safe and metabolic profile, but patient stated that she is not interested in those medications.  She reports that she is interested in decreasing the doses of Seroquel , but would like to stick with Seroquel .  Patient has been educated on the need to talk to her next provider who will be managing her psychotropic medications at [facility] regarding alternatives to Seroquel  for management of her mental status.  We will defer adjustments of psychotropic medications to her next mental health team.  Recommendations: Patient is being transferred to [facility] who are accepting this patient for management of her mental status, as this is a higher level of care for her.  We will defer all medication adjustments to this hospital.  Due to the intensity of patient's symptoms, specifically agitation and psychosis, patient is unable to be managed at the facility based treatment center at the [facility].  Patient initially agreeable to going to [facility] on a voluntary basis, but then changed her mind and requested to sign a 72 hr notice for discharge. Patient's is continuing to present with a distorted thought process, and is unable to rationalize. She is minimizing her symptoms, has poor insight regarding her need for mental health treatment. She will however, benefit from an inpatient behavioral health unit for treatment and stabilization of her mental health. Due to her inability to consent to a voluntary treatment, we will pursue involuntary treatment since she is still paranoid, did not answer question regarding HI, and it is suspicious  that she is continuing to harbor  the idead of wanting to burn down some building and [employer] building as she had initially indicating on initial assessment.  Total Time spent with patient: 1.5 hours  Past Psychiatric History: see H  & P Past Medical History: see H & P Family History: see H & P Family Psychiatric History: see H & P Social History: see H & P Tobacco Cessation:  A prescription for an FDA-approved tobacco cessation medication was offered at discharge and the patient refused  Current Medications:  Current Facility-Administered Medications  Medication Dose Route Frequency Provider Last Rate Last Admin   acetaminophen  (TYLENOL ) tablet 650 mg  650 mg Oral Q6H PRN Lenard Calin, MD       albuterol  (VENTOLIN  HFA) 108 (90 Base) MCG/ACT inhaler 1 puff  1 puff Inhalation Q4H PRN Lenard Calin, MD       alum & mag hydroxide-simeth (MAALOX/MYLANTA) 200-200-20 MG/5ML suspension 30 mL  30 mL Oral Q4H PRN Lenard Calin, MD       haloperidol  (HALDOL ) tablet 5 mg  5 mg Oral TID PRN Lenard Calin, MD       And   diphenhydrAMINE  (BENADRYL ) capsule 50 mg  50 mg Oral TID PRN Lenard Calin, MD       haloperidol  lactate (HALDOL ) injection 5 mg  5 mg Intramuscular TID PRN Lenard Calin, MD       And   diphenhydrAMINE  (BENADRYL ) injection 50 mg  50 mg Intramuscular TID PRN Lenard Calin, MD       And   LORazepam  (ATIVAN ) injection 2 mg  2 mg Intramuscular TID PRN Lenard Calin, MD       hydrOXYzine  (ATARAX ) tablet 25 mg  25 mg Oral TID PRN Lenard Calin, MD       magnesium  hydroxide (MILK OF MAGNESIA) suspension 30 mL  30 mL Oral Daily PRN Lenard Calin, MD       QUEtiapine  (SEROQUEL ) tablet 100 mg  100 mg Oral Daily Lenard Calin, MD   100 mg at 01/28/24 9068   QUEtiapine  (SEROQUEL ) tablet 300 mg  300 mg Oral QHS Lenard Calin, MD       traZODone  (DESYREL ) tablet 100 mg  100 mg Oral QHS PRN Lenard Calin, MD       white petrolatum  (VASELINE) gel   Topical PRN Lawrnce Garvin PARAS, MD   2 Application at 01/27/24 1749   Current Outpatient Medications  Medication Sig Dispense Refill   acetaminophen  (TYLENOL ) 325 MG tablet Take 650 mg by mouth every 6 (six) hours as needed (Pain).     albuterol  (VENTOLIN   HFA) 108 (90 Base) MCG/ACT inhaler Inhale 1 puff into the lungs every 4 (four) hours as needed for wheezing or shortness of breath. 8.5 each 0   QUEtiapine  (SEROQUEL ) 100 MG tablet Take 1 tablet (100 mg total) by mouth daily. (Patient taking differently: Take 100 mg by mouth See admin instructions. Take 3 tablets (300 mg) PO in morning and 3 tablets (300 mg) PO QHS) 30 tablet 0   QUEtiapine  (SEROQUEL ) 300 MG tablet Take 1 tablet (300 mg total) by mouth at bedtime.      PTA Medications:  Facility Ordered Medications  Medication   acetaminophen  (TYLENOL ) tablet 650 mg   alum & mag hydroxide-simeth (MAALOX/MYLANTA) 200-200-20 MG/5ML suspension 30 mL   magnesium  hydroxide (MILK OF MAGNESIA) suspension 30 mL   haloperidol  (HALDOL ) tablet 5 mg   And   diphenhydrAMINE  (BENADRYL ) capsule 50 mg   haloperidol  lactate (HALDOL ) injection  5 mg   And   diphenhydrAMINE  (BENADRYL ) injection 50 mg   And   LORazepam  (ATIVAN ) injection 2 mg   hydrOXYzine  (ATARAX ) tablet 25 mg   traZODone  (DESYREL ) tablet 100 mg   albuterol  (VENTOLIN  HFA) 108 (90 Base) MCG/ACT inhaler 1 puff   QUEtiapine  (SEROQUEL ) tablet 100 mg   QUEtiapine  (SEROQUEL ) tablet 300 mg   white petrolatum  (VASELINE) gel   PTA Medications  Medication Sig   QUEtiapine  (SEROQUEL ) 100 MG tablet Take 1 tablet (100 mg total) by mouth daily. (Patient taking differently: Take 100 mg by mouth See admin instructions. Take 3 tablets (300 mg) PO in morning and 3 tablets (300 mg) PO QHS)   albuterol  (VENTOLIN  HFA) 108 (90 Base) MCG/ACT inhaler Inhale 1 puff into the lungs every 4 (four) hours as needed for wheezing or shortness of breath.   acetaminophen  (TYLENOL ) 325 MG tablet Take 650 mg by mouth every 6 (six) hours as needed (Pain).   QUEtiapine  (SEROQUEL ) 300 MG tablet Take 1 tablet (300 mg total) by mouth at bedtime.        No data to display          Flowsheet Row ED from 01/27/2024 in [facility] ED from  01/26/2024 in [facility] Ad[facility] (Discharged[facility]  C-SSRS RISK CATEGORY No Risk No Risk High Risk    Musculoskeletal  Strength & Muscle Tone: within normal limits Gait & Station: normal Patient leans: N/A  Psychiatric Specialty Exam  Presentation  General Appearance:  Fairly Groomed  Eye Contact: Fair  Speech: Clear and Coherent  Speech Volume: Normal  Handedness: Right   Mood and Affect  Mood: Depressed; Anxious; Angry  Affect: Congruent   Thought Process  Thought Processes: Coherent  Descriptions of Associations:Intact  Orientation:Full (Time, Place and Person)  Thought Content:Logical; WDL  Diagnosis of Schizophrenia or Schizoaffective disorder in past: No  Duration of Psychotic Symptoms: Greater than six months   Hallucinations:Hallucinations: None  Ideas of Reference:None  Suicidal Thoughts:Suicidal Thoughts: No  Homicidal Thoughts:Homicidal Thoughts: No   Sensorium  Memory: Recent Poor  Judgment: Poor  Insight: Poor   Executive Functions  Concentration: Poor  Attention Span: Fair  Recall: Poor  Fund of Knowledge: Poor  Language: Fair   Psychomotor Activity  Psychomotor Activity: Psychomotor Activity: Normal   Assets  Assets: Desire for Improvement; Housing   Sleep  Sleep: Sleep: Poor  Estimated Sleeping Duration (Last 24 Hours): 12.50-13.00 hours  Nutritional Assessment (For OBS and FBC admissions only) Has the patient recently lost weight without trying?: 0 Has the patient been eating poorly because of a decreased appetite?: 0 Malnutrition Screening Tool Score: 0    Physical Exam  Physical Exam Vitals and nursing note reviewed.  Neurological:     General: No focal deficit present.     Mental Status: She is oriented to person, place, and time.    Review of Systems  Psychiatric/Behavioral:  Positive for  depression, hallucinations and substance abuse. Negative for memory loss and suicidal ideas. The patient is nervous/anxious and has insomnia.   All other systems reviewed and are negative.  Blood pressure 113/65, pulse 78, temperature 97.8 F (36.6 C), temperature source Oral, resp. rate 18, height 5' 1.81 (1.57 m), weight 237 lb (107.5 kg), SpO2 100%, unknown if currently breastfeeding. Body mass index is 43.61 kg/m.  Demographic Factors:  Low socioeconomic status  Loss Factors: Financial problems/change in socioeconomic status  Historical Factors: Impulsivity  Risk Reduction Factors:   Responsible for children under 68 years of age  Continued Clinical Symptoms:  Alcohol/Substance Abuse/Dependencies Previous Psychiatric Diagnoses and Treatments  Cognitive Features That Contribute To Risk:  Closed-mindedness and None    Suicide Risk:  Moderate:  Frequent suicidal ideation with limited intensity, and duration, some specificity in terms of plans, no associated intent, good self-control, limited dysphoria/symptomatology, some risk factors present, and identifiable protective factors, including available and accessible social support.  Plan Of Care/Follow-up recommendations:  Other:  Transferring to a higher level of care at [facility], NP 01/28/2024, 2:48 PM

## 2024-01-28 NOTE — ED Notes (Signed)
 Pt generally cooperative, currently socializing in dayroom with peers.

## 2024-01-28 NOTE — ED Notes (Signed)
 Pt refuses her bedtime medication, stating she is hungry again and will not take her medication on empty stomach. Writer explained to her she had diner, snack with juice and snack again, so she is not taking medication on empty stomach. Pt insisted on not taking the medication.

## 2024-01-28 NOTE — ED Notes (Addendum)
 Pt agitated and banging doors after requesting for towels, food and complaining the bathrooms of having trash can.

## 2024-01-29 DIAGNOSIS — R45851 Suicidal ideations: Secondary | ICD-10-CM | POA: Diagnosis not present

## 2024-01-29 DIAGNOSIS — F1514 Other stimulant abuse with stimulant-induced mood disorder: Secondary | ICD-10-CM | POA: Diagnosis not present

## 2024-01-29 NOTE — ED Notes (Signed)
 RN gave report to to [employer] at [facility]. Sheriff was also contacted for transport. Pt and RN discussed pt discharge to hospital, pt affirmed understanding and was in agreement. Pt was provided with snack. Pt ambulatory and denied complaints. Items sent with pt. Pt escorted off unit by staff to sheriff.

## 2024-01-29 NOTE — ED Notes (Signed)
 Pt is sleeping, no acute distress noted. Respirations are even and labored. Q15 safety checks in place.

## 2024-01-29 NOTE — ED Provider Notes (Signed)
 The patient is discharging to [facility] today. She is currently under IVC. Nursing has contacted the sheriff's department to coordinate transportation.  Eleni Molt, is seen face to face by this provider, consulted with Dr. Lawrnce; and chart reviewed on 01/29/24.  On evaluation Glender [place] reports she slept well overnight. She reports her mood as "okay" and her appetite is good. She denies suicidal or homicidal ideation, as well as auditory or visual hallucinations.  She is aware that she is going to an inpatient facility for mental health stabilization.  During evaluation Delorice Buccheri is sitting upright on her bed and appears to be in no acute distress. She is alert and oriented 4, calm, cooperative, and attentive during this assessment. Her mood is described as "okay," with a congruent affect. She appears to exhibit some poverty of speech, with minimal verbal responses. Her behavior is within normal limits. The patient denies auditory or visual hallucinations, although this is difficult to fully assess due to limited interaction. Delusional thinking was not observed during the interaction. She does not appear to be responding to internal or external stimuli.  She is able to converse coherently, with goal-directed thoughts, no distractibility, and no signs of preoccupation. She also denies suicidal, self-harm, or homicidal ideation. The patient answered questions appropriately.

## 2024-01-29 NOTE — ED Notes (Signed)
 Pt generally irritable, displays verbally aggressive voice tone and volume, no threats. Pt also observed to ask the same/ similar questions multiple times over the hour. Pt reports that she is generally 'unhappy', denies specific reason. Pt denied si hi and avh- verbal contract for safety provided. Pt denied physical pain and discomfort. Pt reports to have eaten breakfast. Pt also informed that Ensure supplement drinks are not included in her medication orders.

## 2024-01-29 NOTE — ED Notes (Signed)
 RN reviewed urine testing results with pt as requested. RN and pt discussed discharge planning with pt, pt became verbally upset and aggressive.

## 2024-02-02 ENCOUNTER — Ambulatory Visit (HOSPITAL_COMMUNITY)

## 2024-02-23 ENCOUNTER — Emergency Department (HOSPITAL_COMMUNITY)
Admission: EM | Admit: 2024-02-23 | Discharge: 2024-02-23 | Discharge status: Against Medical Advice | Payer: MEDICAID | Attending: Emergency Medicine | Admitting: Emergency Medicine

## 2024-02-23 ENCOUNTER — Other Ambulatory Visit: Payer: Self-pay

## 2024-02-23 ENCOUNTER — Encounter (HOSPITAL_COMMUNITY): Payer: Self-pay | Admitting: Emergency Medicine

## 2024-02-23 DIAGNOSIS — Z5329 Procedure and treatment not carried out because of patient's decision for other reasons: Secondary | ICD-10-CM | POA: Diagnosis not present

## 2024-02-23 DIAGNOSIS — F419 Anxiety disorder, unspecified: Secondary | ICD-10-CM | POA: Diagnosis present

## 2024-02-23 DIAGNOSIS — R000 Tachycardia, unspecified: Secondary | ICD-10-CM

## 2024-02-23 MED ORDER — LORAZEPAM 1 MG PO TABS
1.0000 mg | ORAL_TABLET | Freq: Once | ORAL | Status: AC
  Administered 2024-02-23: 1 mg via ORAL
  Filled 2024-02-23: qty 1

## 2024-02-23 NOTE — ED Notes (Signed)
 Patient refused bloodwork

## 2024-02-23 NOTE — ED Triage Notes (Signed)
 Patient BIB EMS c/o anxiety. Per EMS patient initial HR was 140-150's. Patient states she only took 1 medication tonight, and she states its too much for me. Patient denies SI/HI. Patient denies drug use. Patient HR In triage 140-150's sustaining.

## 2024-02-23 NOTE — ED Notes (Signed)
 Pt refused all labs. MD made aware.

## 2024-02-23 NOTE — Discharge Instructions (Signed)
 You were seen in the emergency department for your rapid heart rate and anxiety.  You choose to leave against medical advice prior to the completion of your workup.  We cannot say for sure what is causing your rapid heart rate at this time.  You should follow-up with your primary doctor or the behavioral health urgent care for additional workup and evaluation and can return to the emergency department anytime should you choose to have additional workup.

## 2024-02-23 NOTE — ED Notes (Signed)
 Pt refused all labs and is requesting to leave. MD made aware.

## 2024-02-23 NOTE — ED Provider Notes (Signed)
 [place] EMERGENCY DEPARTMENT AT [facility] Provider Note   CSN: [id number] Arrival date & time: 02/23/24  1718     Patient presents with: Anxiety   Cheryl Baxter is a 33 y.o. female.   Patient is a 33 year old female with a past medical history of bipolar disorder and methamphetamine use presenting to the emergency department with anxiety.  Patient reports that every time she takes her Seroquel  she has a panic attack.  She states that she feels hot and her heart racing.  She denies any chest pain or shortness of breath, nausea, vomiting or diarrhea.  She states that she did use meth today.  Denies any alcohol use or caffeine use.  She states that she was recently admitted to inpatient psych and states that she was still having the symptoms then but did not have any changes to her Seroquel .  She denies any SI or HI.  The history is provided by the patient.  Anxiety       Prior to Admission medications   Medication Sig Start Date End Date Taking? Authorizing Provider  acetaminophen  (TYLENOL ) 325 MG tablet Take 650 mg by mouth every 6 (six) hours as needed (Pain).    [provider]  albuterol  (VENTOLIN  HFA) 108 (90 Base) MCG/ACT inhaler Inhale 1 puff into the lungs every 4 (four) hours as needed for wheezing or shortness of breath. 10/04/23   Blair, Christal H, NP  QUEtiapine  (SEROQUEL ) 100 MG tablet Take 1 tablet (100 mg total) by mouth daily. Patient taking differently: Take 100 mg by mouth See admin instructions. Take 3 tablets (300 mg) PO in morning and 3 tablets (300 mg) PO QHS 10/05/23   Bennett, Christal H, NP  QUEtiapine  (SEROQUEL ) 300 MG tablet Take 1 tablet (300 mg total) by mouth at bedtime. 01/27/24   Lenard Calin, MD    Allergies: Penicillins    Review of Systems  Updated Vital Signs BP 115/76   Pulse (!) 128   Temp 97.6 F (36.4 C) (Oral)   Resp 20   SpO2 100%   Physical Exam Vitals and nursing note reviewed.  Constitutional:       General: She is not in acute distress.    Appearance: Normal appearance. She is not toxic-appearing or diaphoretic.  HENT:     Head: Normocephalic and atraumatic.     Nose: Nose normal.     Mouth/Throat:     Mouth: Mucous membranes are moist.     Pharynx: Oropharynx is clear.  Eyes:     Extraocular Movements: Extraocular movements intact.     Conjunctiva/sclera: Conjunctivae normal.  Cardiovascular:     Rate and Rhythm: Regular rhythm. Tachycardia present.     Heart sounds: Normal heart sounds.  Pulmonary:     Effort: Pulmonary effort is normal.     Breath sounds: Normal breath sounds.  Abdominal:     General: Abdomen is flat.     Palpations: Abdomen is soft.  Musculoskeletal:        General: Normal range of motion.     Cervical back: Normal range of motion.  Skin:    General: Skin is warm and dry.  Neurological:     General: No focal deficit present.     Mental Status: She is alert and oriented to person, place, and time.  Psychiatric:     Comments: Anxious mood, cooperative     (all labs ordered are listed, but only abnormal results are displayed) Labs Reviewed  COMPREHENSIVE METABOLIC PANEL  WITH GFR  ETHANOL  CBC  URINE DRUG SCREEN  HCG, SERUM, QUALITATIVE  MAGNESIUM     EKG: EKG Interpretation Date/Time:  Monday February 23 2024 17:31:25 EST Ventricular Rate:  122 PR Interval:  188 QRS Duration:  86 QT Interval:  315 QTC Calculation: 449 R Axis:   100  Text Interpretation: Sinus tachycardia Consider right atrial enlargement Borderline right axis deviation Consider anterior infarct Borderline T abnormalities, inferior leads No significant change since last tracing Confirmed by Emil Share [id number]) on 02/23/2024 5:54:50 PM  Radiology: No results found.   Procedures   Medications Ordered in the ED  LORazepam  (ATIVAN ) tablet 1 mg (1 mg Oral Given 02/23/24 1846)    Clinical Course as of 02/23/24 2056  Mon Feb 23, 2024  1916 Patient informed RN that she  does not want to continue work up and would like to leave. After discussing AMA precautions, however, she is now agreeable for work up. Will continue to monitor.  [VK]  2048 Patient again refused labs and requested to be discharged with anxiety medication. I explained to her I would not be able to do this and she is now agreeable for lab draw. HR improved to 100s. [VK]  2055 RN attempted labs again and she continued to decline. Will be discharged against medical advice.  [VK]    Clinical Course User Index [VK] Kingsley, [place] Paiz K, DO                                 Medical Decision Making This patient presents to the ED with chief complaint(s) of panic attack with pertinent past medical history of bipolar disorder, substance use disorder which further complicates the presenting complaint. The complaint involves an extensive differential diagnosis and also carries with it a high risk of complications and morbidity.    The differential diagnosis includes arrhythmia, anemia, dehydration, electrolyte abnormality, anxiety/panic attack, substance use/intoxication, withdrawal  Additional history obtained: Additional history obtained from N/A Records reviewed prior [facility] records  ED Course and Reassessment: On patient's arrival she is tachycardic but otherwise hemodynamically stable, no acute distress.  Does appear anxious appearing and did endorse methamphetamine use which may be contributing to her symptoms.  EKG on arrival showed sinus tachycardia without acute ischemic changes.  She had labs ordered in triage that are pending at this time.  Will be given anxiolysis and will be closely reassessed.  Independent labs interpretation:  N/A  Independent visualization of imaging: - N/A  Consultation: - Consulted or discussed management/test interpretation w/ external professional: N/A  Consideration for admission or further workup: considered lab work up however patient left against medical  advice prior to work up Social Determinants of health: substance use disorder    Amount and/or Complexity of Data Reviewed Labs: ordered.  Risk Prescription drug management.       Final diagnoses:  Anxiety  Left against medical advice  Tachycardia    ED Discharge Orders     None          Kingsley, Nomi Rudnicki K, DO 02/23/24 1921

## 2024-02-24 ENCOUNTER — Emergency Department (HOSPITAL_COMMUNITY): Payer: MEDICAID

## 2024-02-24 ENCOUNTER — Emergency Department (HOSPITAL_COMMUNITY)
Admission: EM | Admit: 2024-02-24 | Discharge: 2024-02-25 | Disposition: A | Discharge status: Home / Self Care | Payer: MEDICAID | Attending: Emergency Medicine | Admitting: Emergency Medicine

## 2024-02-24 DIAGNOSIS — F1910 Other psychoactive substance abuse, uncomplicated: Secondary | ICD-10-CM

## 2024-02-24 DIAGNOSIS — E876 Hypokalemia: Secondary | ICD-10-CM | POA: Diagnosis not present

## 2024-02-24 DIAGNOSIS — F419 Anxiety disorder, unspecified: Secondary | ICD-10-CM | POA: Diagnosis present

## 2024-02-24 NOTE — ED Triage Notes (Signed)
 Pt coming in after being at [facility] long. Pt reports that she is having an anxiety attack.

## 2024-02-24 NOTE — ED Notes (Signed)
 Pt put herself on the floor and started yelling I need help they aren't helping me

## 2024-02-24 NOTE — ED Notes (Signed)
 Pt is repeatedly calling 911 reporting I can't breathe

## 2024-02-24 NOTE — ED Notes (Signed)
 Pt continued to yell and ask to see a doctor. Pt reports feeling dizzy, however, maintained her seated position. After continued shouting, pt pushed herself from her wheelchair onto the floor, as witnessed by Alan PEAK, Mettie Roylance NT, Destinee NT, and [employer]. Pt maintained no visible injuries. Pt stood with ease and was put back in waiting room chair. Pt maintains that her hr is elevated and that she is dizzy. Pt placed herself back on the floor. Pt is currently on the lobby floor, moaning and to be seen by a doctor.

## 2024-02-24 NOTE — ED Notes (Signed)
 RN placed orders for shortness of breath

## 2024-02-24 NOTE — ED Triage Notes (Signed)
 Pt arrives via EMS from home. PT reportedly was seen here yesterday for anxiety, and refused testing. Today pt called EMS for anxiety and tachycardia. PT revealed to EMS that she smoked the Meth.

## 2024-02-24 NOTE — ED Notes (Signed)
 Pt continuing to yell and ask for vital s

## 2024-02-24 NOTE — ED Notes (Signed)
 Pt continuing to yell and ask for a doctor to see her.

## 2024-02-24 NOTE — ED Notes (Signed)
 Pt causing a scene in the lobby, yelling, putting herself on the floor. Security in lobby. Pt called EMS in the lobby.

## 2024-02-24 NOTE — ED Notes (Signed)
 Pt is currently laying on the floor yelling I need help

## 2024-02-25 LAB — CBC
HCT: 42.5 % (ref 36.0–46.0)
Hemoglobin: 14.3 g/dL (ref 12.0–15.0)
MCH: 26.9 pg (ref 26.0–34.0)
MCHC: 33.6 g/dL (ref 30.0–36.0)
MCV: 80 fL (ref 80.0–100.0)
Platelets: 316 K/uL (ref 150–400)
RBC: 5.31 MIL/uL — ABNORMAL HIGH (ref 3.87–5.11)
RDW: 13.4 % (ref 11.5–15.5)
WBC: 8.6 K/uL (ref 4.0–10.5)
nRBC: 0 % (ref 0.0–0.2)

## 2024-02-25 LAB — HCG, SERUM, QUALITATIVE: Preg, Serum: NEGATIVE

## 2024-02-25 LAB — RAPID HIV SCREEN (HIV 1/2 AB+AG)
HIV 1/2 Antibodies: NONREACTIVE
HIV-1 P24 Antigen - HIV24: NONREACTIVE

## 2024-02-25 LAB — HCG, QUANTITATIVE, PREGNANCY: hCG, Beta Chain, Quant, S: 1 m[IU]/mL (ref <5)

## 2024-02-25 LAB — BASIC METABOLIC PANEL WITH GFR
Anion gap: 14 (ref 5–15)
BUN: 5 mg/dL — ABNORMAL LOW (ref 6–20)
CO2: 24 mmol/L (ref 22–32)
Calcium: 8.8 mg/dL — ABNORMAL LOW (ref 8.9–10.3)
Chloride: 99 mmol/L (ref 98–111)
Creatinine, Ser: 0.75 mg/dL (ref 0.44–1.00)
GFR, Estimated: >60 mL/min (ref >60)
Glucose, Bld: 100 mg/dL — ABNORMAL HIGH (ref 70–99)
Potassium: 2.8 mmol/L — ABNORMAL LOW (ref 3.5–5.1)
Sodium: 137 mmol/L (ref 135–145)

## 2024-02-25 LAB — RESP PANEL BY RT-PCR (RSV, FLU A&B, COVID)  RVPGX2
Influenza A by PCR: NEGATIVE
Influenza B by PCR: NEGATIVE
Resp Syncytial Virus by PCR: NEGATIVE
SARS Coronavirus 2 by RT PCR: NEGATIVE

## 2024-02-25 MED ORDER — SODIUM CHLORIDE 0.9 % IV BOLUS
1000.0000 mL | Freq: Once | INTRAVENOUS | Status: AC
  Administered 2024-02-25: 1000 mL via INTRAVENOUS

## 2024-02-25 MED ORDER — LORAZEPAM 1 MG PO TABS
1.0000 mg | ORAL_TABLET | Freq: Once | ORAL | Status: AC
  Administered 2024-02-25: 1 mg via ORAL
  Filled 2024-02-25: qty 1

## 2024-02-25 MED ORDER — POTASSIUM CHLORIDE CRYS ER 20 MEQ PO TBCR
40.0000 meq | EXTENDED_RELEASE_TABLET | Freq: Once | ORAL | Status: AC
  Administered 2024-02-25: 40 meq via ORAL
  Filled 2024-02-25: qty 2

## 2024-02-25 MED ORDER — POTASSIUM CHLORIDE 10 MEQ/100ML IV SOLN
10.0000 meq | INTRAVENOUS | Status: AC
  Administered 2024-02-25 (×2): 10 meq via INTRAVENOUS
  Filled 2024-02-25 (×2): qty 100

## 2024-02-25 MED ORDER — POTASSIUM CHLORIDE CRYS ER 20 MEQ PO TBCR: 20.0000 meq | EXTENDED_RELEASE_TABLET | Freq: Every day | ORAL | 0 refills | Status: AC

## 2024-02-25 MED ORDER — HYDROXYZINE HCL 25 MG PO TABS: 25.0000 mg | ORAL_TABLET | Freq: Four times a day (QID) | ORAL | 0 refills | Status: DC

## 2024-02-25 NOTE — ED Provider Notes (Signed)
 Care assumed from [facility], PA-C at shift change pending BMP.  See his note for full HPI.  In short, patient is a 33 year old female who presents to the ED after using methamphetamine.  Notes she typically self medicates with methamphetamine for anxiety.  Last smoked last night.  At shift change, pending BMP. Patient denies SI, HI, and auditory/visual hallucinations.  Physical Exam  BP 125/83 (BP Location: Right Arm)   Pulse 94   Temp 97.8 F (36.6 C)   Resp 16   Ht 5' 6 (1.676 m)   Wt 102.1 kg   LMP 02/22/2024 (Exact Date)   SpO2 100%   BMI 36.32 kg/m   Physical Exam Vitals and nursing note reviewed.  Constitutional:      General: She is not in acute distress.    Appearance: She is not ill-appearing.  HENT:     Head: Normocephalic.  Eyes:     Pupils: Pupils are equal, round, and reactive to light.  Cardiovascular:     Rate and Rhythm: Normal rate and regular rhythm.     Pulses: Normal pulses.     Heart sounds: Normal heart sounds. No murmur heard.    No friction rub. No gallop.  Pulmonary:     Effort: Pulmonary effort is normal.     Breath sounds: Normal breath sounds.  Abdominal:     General: Abdomen is flat. There is no distension.     Palpations: Abdomen is soft.     Tenderness: There is no abdominal tenderness. There is no guarding or rebound.  Musculoskeletal:        General: Normal range of motion.     Cervical back: Neck supple.  Skin:    General: Skin is warm and dry.  Neurological:     General: No focal deficit present.     Mental Status: She is alert.  Psychiatric:        Mood and Affect: Mood normal.        Behavior: Behavior normal.     Procedures  Procedures  ED Course / MDM   Clinical Course as of 02/25/24 0645  Wed Feb 25, 2024  0032 I was informed by nursing staff that the patient actively is declining lab work and IV placement.  Will give Ativan  p.o. [CF]  0125 Patient inform me that she is convinced that she has an upper  respiratory infection and wants to get tested for HIV. [CF]  0515 Rapid HIV screen (HIV 1/2 Ab+Ag) Negative.  [CF]  0515 hCG, serum, qualitative Negative. [CF]  0515 Resp panel by RT-PCR (RSV, Flu A&B, Covid) Anterior Nasal Swab Negative. [CF]  [id number] On repeat evaluation, patient's oxygenation is well and her heart rate is normalized.  When she gets up and goes to the bathroom she does become mildly tachycardic.  Upon chart review, patient is tachycardic normally when she comes into the emergency department. [CF]  0607 CBC(!) Normal.  [CF]  0607 We have sent 2 specimens down to receive a BMP from the lab and they state that first was hemolyzed and never received the second 1. [CF]    Clinical Course User Index [CF] Theotis Cameron HERO, PA-C   Medical Decision Making Amount and/or Complexity of Data Reviewed Labs: ordered. Decision-making details documented in ED Course.  Risk Prescription drug management.   7:29 AM BMP with hypokalemia at 2.8.  IV and oral potassium given.  Upon reassessment, patient resting comfortably in bed.  Will discharge patient after rounds of potassium.  Patient discharged with oral potassium.  Advised patient to have labs rechecked in 2 to 3 days to ensure potassium has normalized.  Patient denies SI, HI, and auditory/visual hallucinations. Low suspicion for psychosis. Advised patient to stop using methamphetamine. Outpatient resources given. Strict ED precautions discussed with patient. Patient states understanding and agrees to plan. Patient discharged home in no acute distress and stable vitals.    Lorelle Aleck BROCKS, PA-C 02/25/24 1001    Gennaro Bouchard L, DO 02/26/24 [phone]

## 2024-02-25 NOTE — ED Notes (Signed)
 Pt placed on hallway cardiac monitor. Charge nurse made aware. CCMD contacted and made aware. Pt not tolerating K infusion. Infusion slowed x2 to 38m ML/hr. Provider made aware.

## 2024-02-25 NOTE — ED Notes (Signed)
 IV site assessed, blood return noted, and pt denies any discomfort.

## 2024-02-25 NOTE — ED Notes (Signed)
 Written and verbal discharge instructions administered. Pt denies any questions or concerns at this time. Iv discontinued. Cath intact. 2x2 and tape applied to site. Bleeding controlled.

## 2024-02-25 NOTE — Discharge Instructions (Addendum)
 As we discussed, I believe your methamphetamine use is contributing to your anxiety symptoms.  I would highly suggest that you refrain from using methamphetamine as this is a stimulant and it can increase your heart rate, heighten your senses, and make you more anxious.  I have given you some medication for anxiety.  Please take this as prescribed.  Do not mix this with alcohol or with methamphetamine.  You may return to the emergency department for any worsening symptoms.  Your potassium was low. I am sending you home with potassium. Take for the next 5 days. Have your labs rechecked later this week.

## 2024-02-25 NOTE — ED Notes (Signed)
 Pt refused IV start attempt and lab draw. PA notified.

## 2024-02-25 NOTE — ED Notes (Signed)
Light green tube recollected and sent to lab 

## 2024-02-25 NOTE — ED Notes (Signed)
 Per lab, they did not receive previous light green tube for BMP. Light green tube re-collected and sent again.

## 2024-02-25 NOTE — ED Provider Notes (Signed)
 [place] EMERGENCY DEPARTMENT AT [facility] Provider Note   CSN: [id number] Arrival date & time: 02/24/24  1559     Patient presents with: Anxiety and Drug Problem   Cheryl Baxter is a 33 y.o. female p patient states that patient pain shooting into the muscle atient with past medical history of bipolar disorder and methamphetamine use who presents to the emergency department today for further evaluation of anxiety.  Patient states she has been having anxiety for quite some time and she has been self-medicating with methamphetamine.  Last use today.  She ingests this via smoking.  Patient actively crying during history.  She is complaining of some shortness of breath without chest pain.    Anxiety  Drug Problem       Prior to Admission medications   Medication Sig Start Date End Date Taking? Authorizing Provider  acetaminophen  (TYLENOL ) 325 MG tablet Take 650 mg by mouth every 6 (six) hours as needed (Pain).    [provider]  albuterol  (VENTOLIN  HFA) 108 (90 Base) MCG/ACT inhaler Inhale 1 puff into the lungs every 4 (four) hours as needed for wheezing or shortness of breath. 10/04/23   Blair, Christal H, NP  QUEtiapine  (SEROQUEL ) 100 MG tablet Take 1 tablet (100 mg total) by mouth daily. Patient taking differently: Take 100 mg by mouth See admin instructions. Take 3 tablets (300 mg) PO in morning and 3 tablets (300 mg) PO QHS 10/05/23   Bennett, Christal H, NP  QUEtiapine  (SEROQUEL ) 300 MG tablet Take 1 tablet (300 mg total) by mouth at bedtime. 01/27/24   Lenard Calin, MD    Allergies: Penicillins    Review of Systems  All other systems reviewed and are negative.   Updated Vital Signs BP 111/84 (BP Location: Right Arm)   Pulse 86   Temp 97.8 F (36.6 C)   Resp 18   Ht 5' 6 (1.676 m)   Wt 102.1 kg   LMP 02/22/2024 (Exact Date)   SpO2 100%   BMI 36.32 kg/m   Physical Exam Vitals and nursing note reviewed.  Constitutional:      General:  She is not in acute distress.    Appearance: Normal appearance.     Comments: Tearful.  HENT:     Head: Normocephalic and atraumatic.  Eyes:     General:        Right eye: No discharge.        Left eye: No discharge.  Cardiovascular:     Rate and Rhythm: Tachycardia present.     Comments: S1/S2 are distinct without any evidence of murmur, rubs, or gallops.  Radial pulses are 2+ bilaterally.  Dorsalis pedis pulses are 2+ bilaterally.  No evidence of pedal edema. Pulmonary:     Comments: Clear to auscultation bilaterally.  Normal effort.  No respiratory distress.  No evidence of wheezes, rales, or rhonchi heard throughout. Abdominal:     General: Abdomen is flat. Bowel sounds are normal. There is no distension.     Tenderness: There is no abdominal tenderness. There is no guarding or rebound.  Musculoskeletal:        General: Normal range of motion.     Cervical back: Neck supple.  Skin:    General: Skin is warm and dry.     Findings: No rash.  Neurological:     General: No focal deficit present.     Mental Status: She is alert.  Psychiatric:        Mood  and Affect: Mood is anxious.        Behavior: Behavior normal. Behavior is cooperative.     (all labs ordered are listed, but only abnormal results are displayed) Labs Reviewed  CBC  HCG, SERUM, QUALITATIVE  BASIC METABOLIC PANEL WITH GFR    EKG: None  Radiology: DG Chest 2 View Result Date: 02/24/2024 EXAM: 2 VIEW(S) XRAY OF THE CHEST 02/24/2024 11:41:00 PM COMPARISON: None available. CLINICAL HISTORY: Shortness of breath Shortness of breath FINDINGS: LUNGS AND PLEURA: No focal pulmonary opacity. No pleural effusion. No pneumothorax. HEART AND MEDIASTINUM: No acute abnormality of the cardiac and mediastinal silhouettes. BONES AND SOFT TISSUES: No acute osseous abnormality. IMPRESSION: 1. No acute process. Electronically signed by: Oneil Devonshire MD 02/24/2024 11:45 PM EST RP Workstation: [id number]     Procedures    Medications Ordered in the ED  LORazepam  (ATIVAN ) tablet 1 mg (has no administration in time range)    Clinical Course as of 02/25/24 0648  Wed Feb 25, 2024  0032 I was informed by nursing staff that the patient actively is declining lab work and IV placement.  Will give Ativan  p.o. [CF]  0125 Patient inform me that she is convinced that she has an upper respiratory infection and wants to get tested for HIV. [CF]  0515 Rapid HIV screen (HIV 1/2 Ab+Ag) Negative.  [CF]  0515 hCG, serum, qualitative Negative. [CF]  0515 Resp panel by RT-PCR (RSV, Flu A&B, Covid) Anterior Nasal Swab Negative. [CF]  [id number] On repeat evaluation, patient's oxygenation is well and her heart rate is normalized.  When she gets up and goes to the bathroom she does become mildly tachycardic.  Upon chart review, patient is tachycardic normally when she comes into the emergency department. [CF]  0607 CBC(!) Normal.  [CF]  0607 We have sent 2 specimens down to receive a BMP from the lab and they state that first was hemolyzed and never received the second 1. [CF]    Clinical Course User Index [CF] Theotis Cameron HERO, PA-C   Medical Decision Making Cheryl Baxter is a 33 y.o. female patient who presents to the emergency department today for further evaluation of anxiety symptoms.  I suspect this is likely related to her methamphetamine use.  Patient was very reluctant to tell me that she has been using methamphetamine and states initially that today was her first time but upon further interview she has been using on a regular basis to self-medicate for anxiety.  She reports associated shortness of breath here.  Patient is tachycardic approximately 117 bpm on EKG. Low suspicion of ACS. Will plan to give her some Ativan  and reassess.   Patient's workup is ultimately normal.  Still waiting on a BMP as highlighted in the ED course.  If this is normal patient can likely go home. She was advised to stop using methamphetamine as  late contributing to her anxiety.  Due to shift change, patient's care was transferred to oncoming provider.  Ultimate disposition is pending.  Amount and/or Complexity of Data Reviewed Labs: ordered. Decision-making details documented in ED Course.  Risk Prescription drug management.      Final diagnoses:  None    ED Discharge Orders     None          Theotis Cameron [place], [place] 02/25/24 9350    Raford Lenis, MD 02/25/24 [phone]

## 2024-03-09 ENCOUNTER — Emergency Department (HOSPITAL_COMMUNITY)
Admission: EM | Admit: 2024-03-09 | Discharge: 2024-03-10 | Disposition: A | Discharge status: Home / Self Care | Payer: MEDICAID | Attending: Emergency Medicine | Admitting: Emergency Medicine

## 2024-03-09 DIAGNOSIS — E876 Hypokalemia: Secondary | ICD-10-CM | POA: Insufficient documentation

## 2024-03-09 DIAGNOSIS — F419 Anxiety disorder, unspecified: Secondary | ICD-10-CM | POA: Insufficient documentation

## 2024-03-09 DIAGNOSIS — R000 Tachycardia, unspecified: Secondary | ICD-10-CM | POA: Insufficient documentation

## 2024-03-10 ENCOUNTER — Encounter (HOSPITAL_COMMUNITY): Payer: Self-pay

## 2024-03-10 ENCOUNTER — Other Ambulatory Visit: Payer: Self-pay

## 2024-03-10 LAB — BASIC METABOLIC PANEL WITH GFR
Anion gap: 14 (ref 5–15)
BUN: 12 mg/dL (ref 6–20)
CO2: 26 mmol/L (ref 22–32)
Calcium: 9.7 mg/dL (ref 8.9–10.3)
Chloride: 98 mmol/L (ref 98–111)
Creatinine, Ser: 0.88 mg/dL (ref 0.44–1.00)
GFR, Estimated: >60 mL/min (ref >60)
Glucose, Bld: 96 mg/dL (ref 70–99)
Potassium: 3.5 mmol/L (ref 3.5–5.1)
Sodium: 137 mmol/L (ref 135–145)

## 2024-03-10 LAB — URINALYSIS, ROUTINE W REFLEX MICROSCOPIC
Bilirubin Urine: NEGATIVE
Glucose, UA: NEGATIVE mg/dL
Hgb urine dipstick: NEGATIVE
Ketones, ur: 5 mg/dL — AB
Leukocytes,Ua: NEGATIVE
Nitrite: NEGATIVE
Protein, ur: NEGATIVE mg/dL
Specific Gravity, Urine: 1.012 (ref 1.005–1.030)
pH: 5 (ref 5.0–8.0)

## 2024-03-10 LAB — CBC WITH DIFFERENTIAL/PLATELET
Abs Immature Granulocytes: 0.03 K/uL (ref 0.00–0.07)
Basophils Absolute: 0.1 K/uL (ref 0.0–0.1)
Basophils Relative: 1 %
Eosinophils Absolute: 0.1 K/uL (ref 0.0–0.5)
Eosinophils Relative: 1 %
HCT: 40 % (ref 36.0–46.0)
Hemoglobin: 13.5 g/dL (ref 12.0–15.0)
Immature Granulocytes: 0 %
Lymphocytes Relative: 18 %
Lymphs Abs: 1.8 K/uL (ref 0.7–4.0)
MCH: 27.1 pg (ref 26.0–34.0)
MCHC: 33.8 g/dL (ref 30.0–36.0)
MCV: 80.2 fL (ref 80.0–100.0)
Monocytes Absolute: 0.7 K/uL (ref 0.1–1.0)
Monocytes Relative: 7 %
Neutro Abs: 7.6 K/uL (ref 1.7–7.7)
Neutrophils Relative %: 73 %
Platelets: 363 K/uL (ref 150–400)
RBC: 4.99 MIL/uL (ref 3.87–5.11)
RDW: 14.1 % (ref 11.5–15.5)
WBC: 10.2 K/uL (ref 4.0–10.5)
nRBC: 0 % (ref 0.0–0.2)

## 2024-03-10 LAB — HCG, SERUM, QUALITATIVE: Preg, Serum: NEGATIVE

## 2024-03-10 NOTE — Discharge Instructions (Signed)
 Lab work today is reassuring.  Your potassium is normal.  Please follow-up with your primary care provider.  If you do not have a primary care provider, please follow-up with [place] and wellness. You can also follow-up with the health department to discuss STI testing.

## 2024-03-10 NOTE — ED Notes (Signed)
 Went to try to draw pt's labs. Pt asked to be tested for HIV due to the work she used to do. RN notified.

## 2024-03-10 NOTE — ED Provider Notes (Signed)
  EMERGENCY DEPARTMENT AT [facility] Provider Note   CSN: [id number] Arrival date & time: 03/09/24  2355     Patient presents with: Anxiety   Cheryl Baxter is a 33 y.o. female.   33 year old female brought in by EMS.  Patient states that she did ecstasy tonight and was cleaning her bathroom mirror with warm soap and water  when she began to feel like she was enclosed in a tight space and like she might pass out.  Patient was afraid to take her anxiety medication because she had taken the ecstasy already.  States that she looked at her MyChart from her last ER visit 2 weeks ago and her potassium is low.  She would like to be given additional potassium with a refill of her potassium prescription but would not like to have her lab work checked.       Prior to Admission medications   Medication Sig Start Date End Date Taking? Authorizing Provider  acetaminophen  (TYLENOL ) 325 MG tablet Take 650 mg by mouth every 6 (six) hours as needed (Pain).    [provider]  albuterol  (VENTOLIN  HFA) 108 (90 Base) MCG/ACT inhaler Inhale 1 puff into the lungs every 4 (four) hours as needed for wheezing or shortness of breath. 10/04/23   Blair, Christal H, NP  hydrOXYzine  (ATARAX ) 25 MG tablet Take 1 tablet (25 mg total) by mouth every 6 (six) hours. 02/25/24   Theotis Peers M, PA-C  potassium chloride  SA (KLOR-CON  M) 20 MEQ tablet Take 1 tablet (20 mEq total) by mouth daily for 5 days. 02/25/24 03/01/24  Lorelle Aleck BROCKS, PA-C  QUEtiapine  (SEROQUEL ) 100 MG tablet Take 1 tablet (100 mg total) by mouth daily. Patient taking differently: Take 100 mg by mouth See admin instructions. Take 3 tablets (300 mg) PO in morning and 3 tablets (300 mg) PO QHS 10/05/23   Bennett, Christal H, NP  QUEtiapine  (SEROQUEL ) 300 MG tablet Take 1 tablet (300 mg total) by mouth at bedtime. 01/27/24   Lenard Calin, MD    Allergies: Penicillins    Review of Systems Negative except as per  HPI Updated Vital Signs BP 103/74   Pulse (!) 104   Temp 98.7 F (37.1 C) (Oral)   Resp (!) 24   Ht 5' 6 (1.676 m)   Wt 102.1 kg   LMP 02/22/2024 (Exact Date)   SpO2 98%   BMI 36.32 kg/m   Physical Exam Vitals and nursing note reviewed.  Constitutional:      General: She is not in acute distress.    Appearance: She is well-developed. She is not diaphoretic.  HENT:     Head: Normocephalic and atraumatic.  Cardiovascular:     Rate and Rhythm: Regular rhythm. Tachycardia present.     Heart sounds: Normal heart sounds.  Pulmonary:     Effort: Pulmonary effort is normal.     Breath sounds: Normal breath sounds.  Skin:    General: Skin is warm and dry.     Findings: No erythema or rash.  Neurological:     Mental Status: She is alert and oriented to person, place, and time.  Psychiatric:        Behavior: Behavior normal.     (all labs ordered are listed, but only abnormal results are displayed) Labs Reviewed  URINALYSIS, ROUTINE W REFLEX MICROSCOPIC - Abnormal; Notable for the following components:      Result Value   APPearance HAZY (*)    Ketones, ur 5 (*)  All other components within normal limits  CBC WITH DIFFERENTIAL/PLATELET  BASIC METABOLIC PANEL WITH GFR  HCG, SERUM, QUALITATIVE    EKG: None  Radiology: No results found.   Procedures   Medications Ordered in the ED - No data to display                                  Medical Decision Making Amount and/or Complexity of Data Reviewed Labs: ordered.   This patient presents to the ED for concern of feeling like the room is closing in, this involves an extensive number of treatment options, and is a complaint that carries with it a high risk of complications and morbidity.  The differential diagnosis includes anxiety, substance abuse    Co morbidities / Chronic conditions that complicate the patient evaluation  Substance abuse, anxiety    Additional history obtained:  Additional history  obtained from EMR External records from outside source obtained and reviewed including prior labs and imaging    Lab Tests:  I Ordered, and personally interpreted labs.  The pertinent results include: hCG negative.  CBC within normals.  BMP with normals.  Urinalysis with low potassium and few ketones.    Problem List / ED Course / Critical interventions / Medication management  33 yo female with complaint of feeling like the room was closing in after taking ectasy tonight. On arrival, concerned her K may be low has it was low on her last visit 2 weeks ago (reports diarrhea at that time, none today). Work up reassuring. Advised not to do ecstasy or other drugs. Follow up with PCP/health department for STI screening questions.  I have reviewed the patients home medicines and have made adjustments as needed   Social Determinants of Health:  No PCP on file   Test / Admission - Considered:  Stable for dc      Final diagnoses:  Anxiety    ED Discharge Orders     None          Beverley Leita DELENA DEVONNA 03/10/24 0323    Raford Lenis, MD 03/10/24 [phone]

## 2024-03-10 NOTE — ED Triage Notes (Signed)
 PER EMS: pt is from home, told EMS she took Ecstasy tonight, possible meth use. She told EMS she felt anxious but did not want to take her Atarax  because she wasn't sure how it would react with the drugs. She reports feeling dizzy. A&OX4, GCS 15.  BP- 138/90, RR-24, HR-130 97% RA

## 2024-03-22 ENCOUNTER — Ambulatory Visit (INDEPENDENT_AMBULATORY_CARE_PROVIDER_SITE_OTHER): Payer: MEDICAID | Admitting: Licensed Clinical Social Worker

## 2024-03-22 ENCOUNTER — Encounter (HOSPITAL_COMMUNITY): Payer: Self-pay | Admitting: Licensed Clinical Social Worker

## 2024-03-22 DIAGNOSIS — F4310 Post-traumatic stress disorder, unspecified: Secondary | ICD-10-CM | POA: Insufficient documentation

## 2024-03-22 DIAGNOSIS — F315 Bipolar disorder, current episode depressed, severe, with psychotic features: Secondary | ICD-10-CM

## 2024-03-22 DIAGNOSIS — F1111 Opioid abuse, in remission: Secondary | ICD-10-CM | POA: Insufficient documentation
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
## 2024-03-24 ENCOUNTER — Encounter (HOSPITAL_COMMUNITY): Payer: Self-pay | Admitting: Physician Assistant

## 2024-03-24 ENCOUNTER — Ambulatory Visit (INDEPENDENT_AMBULATORY_CARE_PROVIDER_SITE_OTHER): Payer: MEDICAID | Admitting: Physician Assistant

## 2024-03-24 VITALS — BP 111/71 | HR 76 | Temp 97.4°F | Ht 66.0 in | Wt 234.2 lb

## 2024-03-24 DIAGNOSIS — Z758 Other problems related to medical facilities and other health care: Secondary | ICD-10-CM

## 2024-03-24 DIAGNOSIS — F1910 Other psychoactive substance abuse, uncomplicated: Secondary | ICD-10-CM | POA: Diagnosis not present

## 2024-03-24 DIAGNOSIS — F411 Generalized anxiety disorder: Secondary | ICD-10-CM

## 2024-03-24 DIAGNOSIS — F315 Bipolar disorder, current episode depressed, severe, with psychotic features: Secondary | ICD-10-CM

## 2024-03-24 DIAGNOSIS — F4310 Post-traumatic stress disorder, unspecified: Secondary | ICD-10-CM

## 2024-03-24 MED ORDER — BUSPIRONE HCL 7.5 MG PO TABS: 7.5000 mg | ORAL_TABLET | Freq: Two times a day (BID) | ORAL | 2 refills | Status: DC

## 2024-03-24 MED ORDER — HYDROXYZINE HCL 25 MG PO TABS: 25.0000 mg | ORAL_TABLET | Freq: Four times a day (QID) | ORAL | 1 refills | Status: DC
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
## 2024-03-28 ENCOUNTER — Other Ambulatory Visit: Payer: Self-pay

## 2024-03-28 ENCOUNTER — Emergency Department (HOSPITAL_COMMUNITY)
Admission: EM | Admit: 2024-03-28 | Discharge: 2024-03-28 | Disposition: A | Discharge status: Home / Self Care | Payer: MEDICAID | Attending: Emergency Medicine | Admitting: Emergency Medicine

## 2024-03-28 ENCOUNTER — Ambulatory Visit (HOSPITAL_COMMUNITY)
Admission: EM | Admit: 2024-03-28 | Discharge: 2024-03-28 | Disposition: A | Discharge status: Other Acute Inpatient Hospital | Payer: MEDICAID

## 2024-03-28 ENCOUNTER — Encounter (HOSPITAL_COMMUNITY): Payer: Self-pay | Admitting: Emergency Medicine

## 2024-03-28 ENCOUNTER — Emergency Department (HOSPITAL_COMMUNITY)
Admission: EM | Admit: 2024-03-28 | Discharge: 2024-03-29 | Disposition: A | Payer: MEDICAID | Source: Other Acute Inpatient Hospital | Attending: Emergency Medicine | Admitting: Emergency Medicine

## 2024-03-28 ENCOUNTER — Encounter (HOSPITAL_COMMUNITY): Payer: Self-pay | Admitting: Physician Assistant

## 2024-03-28 DIAGNOSIS — F1910 Other psychoactive substance abuse, uncomplicated: Secondary | ICD-10-CM | POA: Diagnosis present

## 2024-03-28 DIAGNOSIS — R451 Restlessness and agitation: Secondary | ICD-10-CM | POA: Diagnosis not present

## 2024-03-28 DIAGNOSIS — T50901A Poisoning by unspecified drugs, medicaments and biological substances, accidental (unintentional), initial encounter: Secondary | ICD-10-CM | POA: Diagnosis present

## 2024-03-28 DIAGNOSIS — Z79899 Other long term (current) drug therapy: Secondary | ICD-10-CM | POA: Diagnosis not present

## 2024-03-28 DIAGNOSIS — F1510 Other stimulant abuse, uncomplicated: Secondary | ICD-10-CM | POA: Diagnosis not present

## 2024-03-28 LAB — CBC WITH DIFFERENTIAL/PLATELET
Abs Immature Granulocytes: 0.03 K/uL (ref 0.00–0.07)
Basophils Absolute: 0 K/uL (ref 0.0–0.1)
Basophils Relative: 0 %
Eosinophils Absolute: 0 K/uL (ref 0.0–0.5)
Eosinophils Relative: 0 %
HCT: 44.8 % (ref 36.0–46.0)
Hemoglobin: 15.2 g/dL — ABNORMAL HIGH (ref 12.0–15.0)
Immature Granulocytes: 0 %
Lymphocytes Relative: 13 %
Lymphs Abs: 1.3 K/uL (ref 0.7–4.0)
MCH: 27.2 pg (ref 26.0–34.0)
MCHC: 33.9 g/dL (ref 30.0–36.0)
MCV: 80.1 fL (ref 80.0–100.0)
Monocytes Absolute: 0.5 K/uL (ref 0.1–1.0)
Monocytes Relative: 5 %
Neutro Abs: 8.1 K/uL — ABNORMAL HIGH (ref 1.7–7.7)
Neutrophils Relative %: 82 %
Platelets: 314 K/uL (ref 150–400)
RBC: 5.59 MIL/uL — ABNORMAL HIGH (ref 3.87–5.11)
RDW: 13.7 % (ref 11.5–15.5)
WBC: 10 K/uL (ref 4.0–10.5)
nRBC: 0 % (ref 0.0–0.2)

## 2024-03-28 LAB — COMPREHENSIVE METABOLIC PANEL WITH GFR
ALT: 8 U/L (ref 0–44)
AST: 24 U/L (ref 15–41)
Albumin: 4.3 g/dL (ref 3.5–5.0)
Alkaline Phosphatase: 84 U/L (ref 38–126)
Anion gap: 17 — ABNORMAL HIGH (ref 5–15)
BUN: <5 mg/dL — ABNORMAL LOW (ref 6–20)
CO2: 18 mmol/L — ABNORMAL LOW (ref 22–32)
Calcium: 9.7 mg/dL (ref 8.9–10.3)
Chloride: 98 mmol/L (ref 98–111)
Creatinine, Ser: 0.91 mg/dL (ref 0.44–1.00)
GFR, Estimated: >60 mL/min (ref >60)
Glucose, Bld: 104 mg/dL — ABNORMAL HIGH (ref 70–99)
Potassium: 3.4 mmol/L — ABNORMAL LOW (ref 3.5–5.1)
Sodium: 133 mmol/L — ABNORMAL LOW (ref 135–145)
Total Bilirubin: 0.7 mg/dL (ref 0.0–1.2)
Total Protein: 8.1 g/dL (ref 6.5–8.1)

## 2024-03-28 LAB — URINE DRUG SCREEN
Amphetamines: POSITIVE — AB
Barbiturates: NEGATIVE
Benzodiazepines: POSITIVE — AB
Cocaine: NEGATIVE
Fentanyl: NEGATIVE
Methadone Scn, Ur: NEGATIVE
Opiates: NEGATIVE
Tetrahydrocannabinol: NEGATIVE

## 2024-03-28 LAB — CK: Total CK: 155 U/L (ref 38–234)

## 2024-03-28 LAB — HCG, SERUM, QUALITATIVE: Preg, Serum: NEGATIVE

## 2024-03-28 LAB — ETHANOL: Alcohol, Ethyl (B): <15 mg/dL (ref <15)

## 2024-03-28 MED ORDER — MIDAZOLAM HCL (PF) 2 MG/2ML IJ SOLN
5.0000 mg | Freq: Once | INTRAMUSCULAR | Status: AC
  Administered 2024-03-28: 5 mg via INTRAMUSCULAR
  Filled 2024-03-28: qty 6

## 2024-03-28 MED ORDER — LACTATED RINGERS IV SOLN: INTRAVENOUS | Status: DC

## 2024-03-28 MED ORDER — HALOPERIDOL LACTATE 5 MG/ML IJ SOLN: 2.0000 mg | Freq: Once | INTRAMUSCULAR | Status: DC

## 2024-03-28 MED ORDER — LACTATED RINGERS IV BOLUS
1000.0000 mL | Freq: Once | INTRAVENOUS | Status: AC
  Administered 2024-03-28: 1000 mL via INTRAVENOUS

## 2024-03-28 NOTE — ED Triage Notes (Signed)
 Pt here from [facility] for medical clearance.She states she smoked meth all last night, stopping at 7.  (Apparently went to [facility] and was kicked out).  Pt is highly anxious, stating that she thinks there was poison in the meth because her chest feels like it's burning.  VS:   Bp  112/68 Hr 110 Rr 24 100% RA

## 2024-03-28 NOTE — ED Provider Notes (Signed)
 [place] EMERGENCY DEPARTMENT AT [facility] Provider Note   CSN: [id number] Arrival date & time: 03/28/24  1657     Patient presents with: Ingestion and Drug Problem   Cheryl Baxter is a 33 y.o. female.   33 year old female presents after smoking methadone just prior to arrival.  Patient states that she was trying to become intoxicated.  Denies that this was a suicide attempt.  History is very difficult due to her being very anxious and boisterous.  Denies any other coingestions       Prior to Admission medications  Medication Sig Start Date End Date Taking? Authorizing Provider  acetaminophen  (TYLENOL ) 325 MG tablet Take 650 mg by mouth every 6 (six) hours as needed (Pain).    [provider]  albuterol  (VENTOLIN  HFA) 108 (90 Base) MCG/ACT inhaler Inhale 1 puff into the lungs every 4 (four) hours as needed for wheezing or shortness of breath. 10/04/23   Blair, Christal H, NP  busPIRone  (BUSPAR ) 7.5 MG tablet Take 1 tablet (7.5 mg total) by mouth 2 (two) times daily. 03/24/24   Nwoko, Uchenna E, PA  hydrOXYzine  (ATARAX ) 25 MG tablet Take 1 tablet (25 mg total) by mouth every 6 (six) hours. 03/24/24   Nwoko, Uchenna E, PA  potassium chloride  SA (KLOR-CON  M) 20 MEQ tablet Take 1 tablet (20 mEq total) by mouth daily for 5 days. 02/25/24 03/01/24  Lorelle Aleck BROCKS, PA-C  QUEtiapine  (SEROQUEL ) 100 MG tablet Take 1 tablet (100 mg total) by mouth daily. Patient taking differently: Take 100 mg by mouth See admin instructions. Take 3 tablets (300 mg) PO in morning and 3 tablets (300 mg) PO QHS 10/05/23   Bennett, Christal H, NP  QUEtiapine  (SEROQUEL ) 300 MG tablet Take 1 tablet (300 mg total) by mouth at bedtime. 01/27/24   Lenard Calin, MD    Allergies: Penicillins    Review of Systems  Unable to perform ROS: Acuity of condition    Updated Vital Signs There were no vitals taken for this visit.  Physical Exam Vitals and nursing note reviewed.   Constitutional:      General: She is not in acute distress.    Appearance: Normal appearance. She is well-developed. She is not toxic-appearing.  HENT:     Head: Normocephalic and atraumatic.  Eyes:     General: Lids are normal.     Conjunctiva/sclera: Conjunctivae normal.     Pupils: Pupils are equal, round, and reactive to light.  Neck:     Thyroid : No thyroid  mass.     Trachea: No tracheal deviation.  Cardiovascular:     Rate and Rhythm: Normal rate and regular rhythm.     Heart sounds: Normal heart sounds. No murmur heard.    No gallop.  Pulmonary:     Effort: Pulmonary effort is normal. No respiratory distress.     Breath sounds: Normal breath sounds. No stridor. No decreased breath sounds, wheezing, rhonchi or rales.  Abdominal:     General: There is no distension.     Palpations: Abdomen is soft.     Tenderness: There is no abdominal tenderness. There is no rebound.  Musculoskeletal:        General: No tenderness. Normal range of motion.     Cervical back: Normal range of motion and neck supple.  Skin:    General: Skin is warm and dry.     Findings: No abrasion or rash.  Neurological:     Mental Status: She is alert and  oriented to person, place, and time. Mental status is at baseline.     GCS: GCS eye subscore is 4. GCS verbal subscore is 5. GCS motor subscore is 6.     Cranial Nerves: No cranial nerve deficit.     Sensory: No sensory deficit.     Motor: Motor function is intact.  Psychiatric:        Attention and Perception: Attention normal.        Mood and Affect: Mood is elated.        Speech: Speech is rapid and pressured.        Behavior: Behavior is agitated, aggressive and hyperactive.     (all labs ordered are listed, but only abnormal results are displayed) Labs Reviewed - No data to display  EKG: None  Radiology: No results found.   Procedures   Medications Ordered in the ED  midazolam  PF (VERSED ) injection 5 mg (has no administration in  time range)  lactated ringers  bolus 1,000 mL (has no administration in time range)  lactated ringers  infusion (has no administration in time range)                                    Medical Decision Making Amount and/or Complexity of Data Reviewed Labs: ordered. ECG/medicine tests: ordered.  Risk Prescription drug management.   Patient initially combative here related to be verbally redirected.  Was given Versed  5 mg and is now much more cooperative.  Labs here are reassuring.  UDS positive for amphetamines.  Suspect that she had a reaction to using that substance.  She states she feels back to her baseline.  Denies any SI or HI.  Will be discharged home     Final diagnoses:  None    ED Discharge Orders     None          Dasie Faden, MD 03/28/24 2112

## 2024-03-28 NOTE — ED Triage Notes (Addendum)
 Pt arrives via EMS from home. PT called EMS reporting that she took methadone that did not belong to her, and saw a glisten on it. PT arrives to the ER triage area shouting Help me, Help me, you all aren't doing anything loudly.    Pt uncooperative with triage, multiple attempts made to obtain vital signs.

## 2024-03-28 NOTE — ED Provider Triage Note (Signed)
 Emergency Medicine Provider Triage Evaluation Note  Cheryl Baxter , a 33 y.o. female  was evaluated in triage.  Pt complains of drug problem. Seen at [facility] earlier this evening with full set of labs that were reassuring.  Continues to use meth.  UDS + for benzos and amphetamines  Review of Systems  Positive: Drug use Negative: fever  Physical Exam  BP 131/85 (BP Location: Right Arm)   Pulse (!) 114   Temp 98.1 F (36.7 C) (Oral)   Resp 18   SpO2 100%  Gen:   Awake, no distress   Resp:  Normal effort  MSK:   Moves extremities without difficulty  Other:    Medical Decision Making  Medically screening exam initiated at 11:16 PM.  Appropriate orders placed.  Cheryl Baxter was informed that the remainder of the evaluation will be completed by another provider, this initial triage assessment does not replace that evaluation, and the importance of remaining in the ED until their evaluation is complete.  Had labs <4 hours ago that were reassuring.  Admits to ongoing meth use.   Cheryl Olam HERO, PA-C 03/28/24 2317

## 2024-03-28 NOTE — ED Notes (Signed)
 Pt very erratic and will not stay in the bed. Pt is calling out for help every time someone walks by. Pt ambulated to the bathroom without any issue and requested a gown. I provided the gown for her and she is now washing her body at the sink. RN notified and I also sent a message to the doctor stating that pt is requesting to see them.

## 2024-03-28 NOTE — ED Notes (Signed)
 While waiting to be assessed pt called 911 for emergent care, stating she had been poisoned while smoking Meth from yesterday to 1 hour prior to arrival.  Pt reports she swallowed a Poison toxin.  Report feeling dizzy hot and feeling weird..  Reports escorted off property at [facility] after initial evaluation.  EMS at bedside to transport pt to [facility].

## 2024-03-28 NOTE — ED Provider Notes (Cosign Needed Addendum)
 Patient presented to [facility] then immediately contacted 911 on her cell phone requesting to go the ED. Patient reports that she was poisoned while using Meth. Patient is anxious, reports feeling dizzy, V/S BP (!) 126/94 (BP Location: Right Arm)   Pulse (!) 114   Temp 98.4 F (36.9 C) (Oral)   Resp 20   SpO2 100%  EMS present in the lobby to transport patient to the ED per her request. Report called to EDP Dr. Cottie

## 2024-03-29 ENCOUNTER — Emergency Department (HOSPITAL_COMMUNITY)
Admission: EM | Admit: 2024-03-29 | Discharge: 2024-03-29 | Disposition: A | Discharge status: Home / Self Care | Payer: MEDICAID

## 2024-03-29 ENCOUNTER — Other Ambulatory Visit: Payer: Self-pay

## 2024-03-29 ENCOUNTER — Emergency Department (HOSPITAL_COMMUNITY): Payer: MEDICAID

## 2024-03-29 ENCOUNTER — Encounter (HOSPITAL_COMMUNITY): Payer: Self-pay

## 2024-03-29 DIAGNOSIS — R0602 Shortness of breath: Secondary | ICD-10-CM | POA: Diagnosis not present

## 2024-03-29 DIAGNOSIS — F1510 Other stimulant abuse, uncomplicated: Secondary | ICD-10-CM

## 2024-03-29 DIAGNOSIS — Z79899 Other long term (current) drug therapy: Secondary | ICD-10-CM | POA: Diagnosis not present

## 2024-03-29 DIAGNOSIS — F1590 Other stimulant use, unspecified, uncomplicated: Secondary | ICD-10-CM | POA: Diagnosis present

## 2024-03-29 LAB — COMPREHENSIVE METABOLIC PANEL WITH GFR
ALT: 14 U/L (ref 0–44)
AST: 17 U/L (ref 15–41)
Albumin: 4 g/dL (ref 3.5–5.0)
Alkaline Phosphatase: 68 U/L (ref 38–126)
Anion gap: 10 (ref 5–15)
BUN: 5 mg/dL — ABNORMAL LOW (ref 6–20)
CO2: 24 mmol/L (ref 22–32)
Calcium: 9 mg/dL (ref 8.9–10.3)
Chloride: 103 mmol/L (ref 98–111)
Creatinine, Ser: 0.87 mg/dL (ref 0.44–1.00)
GFR, Estimated: >60 mL/min (ref >60)
Glucose, Bld: 112 mg/dL — ABNORMAL HIGH (ref 70–99)
Potassium: 3.3 mmol/L — ABNORMAL LOW (ref 3.5–5.1)
Sodium: 137 mmol/L (ref 135–145)
Total Bilirubin: 1.2 mg/dL (ref 0.0–1.2)
Total Protein: 7.7 g/dL (ref 6.5–8.1)

## 2024-03-29 LAB — RAPID URINE DRUG SCREEN, HOSP PERFORMED
Amphetamines: POSITIVE — AB
Barbiturates: NOT DETECTED
Benzodiazepines: POSITIVE — AB
Cocaine: NOT DETECTED
Opiates: NOT DETECTED
Tetrahydrocannabinol: NOT DETECTED

## 2024-03-29 LAB — CBC WITH DIFFERENTIAL/PLATELET
Abs Immature Granulocytes: 0.01 K/uL (ref 0.00–0.07)
Basophils Absolute: 0 K/uL (ref 0.0–0.1)
Basophils Relative: 1 %
Eosinophils Absolute: 0.1 K/uL (ref 0.0–0.5)
Eosinophils Relative: 1 %
HCT: 41.7 % (ref 36.0–46.0)
Hemoglobin: 14.4 g/dL (ref 12.0–15.0)
Immature Granulocytes: 0 %
Lymphocytes Relative: 29 %
Lymphs Abs: 2.5 K/uL (ref 0.7–4.0)
MCH: 27.1 pg (ref 26.0–34.0)
MCHC: 34.5 g/dL (ref 30.0–36.0)
MCV: 78.5 fL — ABNORMAL LOW (ref 80.0–100.0)
Monocytes Absolute: 0.7 K/uL (ref 0.1–1.0)
Monocytes Relative: 8 %
Neutro Abs: 5.2 K/uL (ref 1.7–7.7)
Neutrophils Relative %: 61 %
Platelets: 384 K/uL (ref 150–400)
RBC: 5.31 MIL/uL — ABNORMAL HIGH (ref 3.87–5.11)
RDW: 13.8 % (ref 11.5–15.5)
WBC: 8.6 K/uL (ref 4.0–10.5)
nRBC: 0 % (ref 0.0–0.2)

## 2024-03-29 LAB — HCG, SERUM, QUALITATIVE: Preg, Serum: NEGATIVE

## 2024-03-29 LAB — ETHANOL: Alcohol, Ethyl (B): <15 mg/dL (ref <15)

## 2024-03-29 MED ORDER — LORAZEPAM 1 MG PO TABS
1.0000 mg | ORAL_TABLET | Freq: Once | ORAL | Status: AC
  Administered 2024-03-29: 12:00:00 1 mg via ORAL
  Filled 2024-03-29: qty 1

## 2024-03-29 MED ORDER — IPRATROPIUM-ALBUTEROL 0.5-2.5 (3) MG/3ML IN SOLN
3.0000 mL | Freq: Once | RESPIRATORY_TRACT | Status: AC
  Administered 2024-03-29: 12:00:00 3 mL via RESPIRATORY_TRACT
  Filled 2024-03-29: qty 3

## 2024-03-29 MED ORDER — POTASSIUM CHLORIDE CRYS ER 20 MEQ PO TBCR
40.0000 meq | EXTENDED_RELEASE_TABLET | Freq: Once | ORAL | Status: AC
  Administered 2024-03-29: 13:00:00 40 meq via ORAL
  Filled 2024-03-29 (×2): qty 2

## 2024-03-29 MED ORDER — IPRATROPIUM-ALBUTEROL 0.5-2.5 (3) MG/3ML IN SOLN
RESPIRATORY_TRACT | Status: AC
  Filled 2024-03-29: qty 3

## 2024-03-29 NOTE — ED Notes (Signed)
Pt went to XRAY

## 2024-03-29 NOTE — ED Notes (Signed)
 KCL explained. D/c explained. Pt initially not wanting neb inhaler. EDP aware. D/c explained with rationale. Alert, NAD, calm, interactive, resps e/u, speaking in clear complete sentences, skin W&D. Requesting ginger ale, given.

## 2024-03-29 NOTE — ED Provider Notes (Signed)
 [place] EMERGENCY DEPARTMENT AT [facility] Provider Note   CSN: [id number] Arrival date & time: 03/29/24  9240     Patient presents with: Addiction Problem   Cheryl Baxter is a 33 y.o. female.   33 year old female with past medical history of methamphetamine abuse presenting to the emergency department today with concern that she was poisoned by her meth.  The patient states that she was smoking methamphetamine yesterday.  She states that she has been having shortness of breath since then that is worse when she drinks water .  She denies any abdominal pain.  The patient denies any specific chest pain.  She was seen here overnight last night and checked back in.  Denies any suicidal or homicidal ideation.        Prior to Admission medications  Medication Sig Start Date End Date Taking? Authorizing Provider  acetaminophen  (TYLENOL ) 325 MG tablet Take 650 mg by mouth every 6 (six) hours as needed (Pain).    [provider]  albuterol  (VENTOLIN  HFA) 108 (90 Base) MCG/ACT inhaler Inhale 1 puff into the lungs every 4 (four) hours as needed for wheezing or shortness of breath. 10/04/23   Blair, Christal H, NP  busPIRone  (BUSPAR ) 7.5 MG tablet Take 1 tablet (7.5 mg total) by mouth 2 (two) times daily. 03/24/24   Nwoko, Uchenna E, PA  hydrOXYzine  (ATARAX ) 25 MG tablet Take 1 tablet (25 mg total) by mouth every 6 (six) hours. 03/24/24   Nwoko, Uchenna E, PA  potassium chloride  SA (KLOR-CON  M) 20 MEQ tablet Take 1 tablet (20 mEq total) by mouth daily for 5 days. 02/25/24 03/01/24  Aberman, Caroline C, PA-C  QUEtiapine  (SEROQUEL ) 100 MG tablet Take 1 tablet (100 mg total) by mouth daily. Patient taking differently: Take 100 mg by mouth See admin instructions. Take 3 tablets (300 mg) PO in morning and 3 tablets (300 mg) PO QHS 10/05/23   Bennett, Christal H, NP  QUEtiapine  (SEROQUEL ) 300 MG tablet Take 1 tablet (300 mg total) by mouth at bedtime. 01/27/24   Lenard Calin,  MD    Allergies: Penicillins    Review of Systems  Respiratory:  Positive for shortness of breath.   All other systems reviewed and are negative.   Updated Vital Signs BP 112/79 (BP Location: Right Arm)   Pulse (!) 102   Temp (!) 97.1 F (36.2 C) (Axillary)   Resp 18   SpO2 100%   Physical Exam Vitals and nursing note reviewed.   Gen: NAD Eyes: PERRL, EOMI HEENT: no oropharyngeal swelling Neck: trachea midline Resp: clear to auscultation bilaterally Card: Tachycardic, no murmurs, rubs, or gallops Abd: nontender, nondistended Extremities: no calf tenderness, no edema Vascular: 2+ radial pulses bilaterally, 2+ DP pulses bilaterally Skin: no rashes Psyc: The patient does seem somewhat paranoid but is easily redirectable   (all labs ordered are listed, but only abnormal results are displayed) Labs Reviewed  COMPREHENSIVE METABOLIC PANEL WITH GFR - Abnormal; Notable for the following components:      Result Value   Potassium 3.3 (*)    Glucose, Bld 112 (*)    BUN 5 (*)    All other components within normal limits  RAPID URINE DRUG SCREEN, HOSP PERFORMED - Abnormal; Notable for the following components:   Benzodiazepines POSITIVE (*)    Amphetamines POSITIVE (*)    All other components within normal limits  CBC WITH DIFFERENTIAL/PLATELET - Abnormal; Notable for the following components:   RBC 5.31 (*)    MCV  78.5 (*)    All other components within normal limits  ETHANOL  HCG, SERUM, QUALITATIVE    EKG: EKG Interpretation Date/Time:  Monday March 29 2024 09:01:18 EST Ventricular Rate:  117 PR Interval:  148 QRS Duration:  78 QT Interval:  328 QTC Calculation: 457 R Axis:   81  Text Interpretation: Sinus tachycardia Nonspecific T wave abnormality Confirmed by Melvenia Motto [phone]) on 03/29/2024 11:21:27 AM  Radiology: DG Chest 1 View Result Date: 03/29/2024 EXAM: 1 VIEW(S) XRAY OF THE CHEST 03/29/2024 12:13:00 PM COMPARISON: Comparison with 02/24/2024.  CLINICAL HISTORY: SOB (shortness of breath). FINDINGS: LUNGS AND PLEURA: Shallow inspiration. Pulmonary vascularity is normal. No focal pulmonary opacity. No pleural effusion. No pneumothorax. HEART AND MEDIASTINUM: Heart size is normal. Mediastinal contours appear intact. BONES AND SOFT TISSUES: No acute osseous abnormality. IMPRESSION: 1. No acute cardiopulmonary process to explain shortness of breath. Electronically signed by: Elsie Gravely MD 03/29/2024 12:48 PM EST RP Workstation: [id number]     Procedures   Medications Ordered in the ED  potassium chloride  SA (KLOR-CON  M) CR tablet 40 mEq (has no administration in time range)  LORazepam  (ATIVAN ) tablet 1 mg (1 mg Oral Given 03/29/24 1225)  ipratropium-albuterol  (DUONEB) 0.5-2.5 (3) MG/3ML nebulizer solution 3 mL (3 mLs Nebulization Not Given 03/29/24 1239)                                    Medical Decision Making 33 year old female with past medical history of methamphetamine abuse presenting to the emergency department today with shortness of breath.  She is concerned that she was poisoned.  Her symptoms seem consistent with methamphetamine abuse.  Her labs drawn at triage are largely unremarkable.  Will obtain an EKG here to evaluate for pneumonitis, pulmonary edema, pulmonary infiltrates, or pneumothorax.  Will give the patient a DuoNeb here.  Suspicion for pulmonary embolism is low at this time.  I will give patient Ativan  here as well as she does seem anxious.  She does not meet criteria for IVC at this time.  The patient's workup here is reassuring.  Potassium is low and the patient is given oral potassium for this.  She refused the albuterol  treatment because she states that it tastes like meth.  Her pulse ox is 100% here.  I think that she is stable for discharge.  Amount and/or Complexity of Data Reviewed Radiology: ordered.  Risk Prescription drug management.        Final diagnoses:  Methamphetamine abuse [facility])     ED Discharge Orders     None          Ula Prentice SAUNDERS, MD 03/29/24 1253

## 2024-03-29 NOTE — ED Provider Notes (Signed)
 [place] EMERGENCY DEPARTMENT AT [facility] Provider Note   CSN: [id number] Arrival date & time: 03/28/24  2250     Patient presents with: Drug Problem   Cheryl Baxter is a 33 y.o. female.   Patient presents to the emergency department with concerns over being poisoned by her methamphetamine.  Reviewing records reveals that she has been to behavioral health twice today.  She was there earlier this morning and before she could be evaluated she called 911 and was brought to [facility].  While there she was administered benzodiazepine, evaluated.  She appeared to improve but after discharge went back to behavioral health.  Once again she called 911 before she could be evaluated, stating that there was something in the methamphetamine and it poisoned her.       Prior to Admission medications  Medication Sig Start Date End Date Taking? Authorizing Provider  acetaminophen  (TYLENOL ) 325 MG tablet Take 650 mg by mouth every 6 (six) hours as needed (Pain).    [provider]  albuterol  (VENTOLIN  HFA) 108 (90 Base) MCG/ACT inhaler Inhale 1 puff into the lungs every 4 (four) hours as needed for wheezing or shortness of breath. 10/04/23   Blair, Christal H, NP  busPIRone  (BUSPAR ) 7.5 MG tablet Take 1 tablet (7.5 mg total) by mouth 2 (two) times daily. 03/24/24   Nwoko, Uchenna E, PA  hydrOXYzine  (ATARAX ) 25 MG tablet Take 1 tablet (25 mg total) by mouth every 6 (six) hours. 03/24/24   Nwoko, Uchenna E, PA  potassium chloride  SA (KLOR-CON  M) 20 MEQ tablet Take 1 tablet (20 mEq total) by mouth daily for 5 days. 02/25/24 03/01/24  Lorelle Aleck BROCKS, PA-C  QUEtiapine  (SEROQUEL ) 100 MG tablet Take 1 tablet (100 mg total) by mouth daily. Patient taking differently: Take 100 mg by mouth See admin instructions. Take 3 tablets (300 mg) PO in morning and 3 tablets (300 mg) PO QHS 10/05/23   Bennett, Christal H, NP  QUEtiapine  (SEROQUEL ) 300 MG tablet Take 1 tablet (300 mg  total) by mouth at bedtime. 01/27/24   Lenard Calin, MD    Allergies: Penicillins    Review of Systems  Updated Vital Signs BP 103/76 (BP Location: Left Arm)   Pulse 93   Temp 98.1 F (36.7 C)   Resp 18   SpO2 100%   Physical Exam Vitals and nursing note reviewed.  Constitutional:      General: She is not in acute distress.    Appearance: She is well-developed.  HENT:     Head: Normocephalic and atraumatic.     Mouth/Throat:     Mouth: Mucous membranes are moist.  Eyes:     General: Vision grossly intact. Gaze aligned appropriately.     Extraocular Movements: Extraocular movements intact.     Conjunctiva/sclera: Conjunctivae normal.  Cardiovascular:     Rate and Rhythm: Normal rate and regular rhythm.     Pulses: Normal pulses.     Heart sounds: Normal heart sounds, S1 normal and S2 normal. No murmur heard.    No friction rub. No gallop.  Pulmonary:     Effort: Pulmonary effort is normal. No respiratory distress.     Breath sounds: Normal breath sounds.  Abdominal:     General: Bowel sounds are normal.     Palpations: Abdomen is soft.     Tenderness: There is no abdominal tenderness. There is no guarding or rebound.     Hernia: No hernia is present.  Musculoskeletal:  General: No swelling.     Cervical back: Full passive range of motion without pain, normal range of motion and neck supple. No spinous process tenderness or muscular tenderness. Normal range of motion.     Right lower leg: No edema.     Left lower leg: No edema.  Skin:    General: Skin is warm and dry.     Capillary Refill: Capillary refill takes less than 2 seconds.     Findings: No ecchymosis, erythema, rash or wound.  Neurological:     General: No focal deficit present.     Mental Status: She is alert and oriented to person, place, and time.     GCS: GCS eye subscore is 4. GCS verbal subscore is 5. GCS motor subscore is 6.     Cranial Nerves: Cranial nerves 2-12 are intact.     Sensory:  Sensation is intact.     Motor: Motor function is intact.     Coordination: Coordination is intact.  Psychiatric:        Attention and Perception: Attention normal.        Mood and Affect: Mood normal.        Speech: Speech normal.        Behavior: Behavior normal.     (all labs ordered are listed, but only abnormal results are displayed) Labs Reviewed - No data to display  EKG: None  Radiology: No results found.   Procedures   Medications Ordered in the ED - No data to display                                  Medical Decision Making  Presents with agitation secondary to substance abuse.  Patient has been in the lobby awaiting a bed assignment for a number of hours and at time of her bedding, she is calm, cooperative and reports that her symptoms are resolved.  Patient given ginger ale and a sandwich, will be appropriate for discharge this morning.     Final diagnoses:  Substance abuse [facility])    ED Discharge Orders     None          Adilene Areola, Lonni PARAS, MD 03/29/24 [phone]

## 2024-03-29 NOTE — Discharge Instructions (Signed)
 Your workup today was reassuring.  Please stop using methamphetamine as this is likely causing your symptoms.  Follow-up with your doctor and return to the ER for worsening symptoms.

## 2024-03-29 NOTE — ED Notes (Signed)
 Pt with concerns that breathing treatment taste like meth. Educated patient on the content of the breathing treatment. Pt hesitant to receive treatment. Breathing treatment turned off due to patient not using neb. Explained to patient that if she does not feel comfortable taking the neb treatment then we should turn it off while she is deciding so that medication is not wasted.

## 2024-03-29 NOTE — ED Provider Triage Note (Signed)
 Emergency Medicine Provider Triage Evaluation Note  Mary-Ann Pennella , a 33 y.o. female  was evaluated in triage.  Pt complains of ongoing multiple complaints.  Patient appreciates poison in her meth, she is requesting transport to a methadone clinic for detox.  She also appreciates lightheadedness, pus coming out of her eyes, weakness, as well as shortness of breath.  Patient has been evaluated multiple times in the last 48 hours, has also called 911 from the behavioral health urgent care twice per chart review.  Patient was just discharged from the emergency department approximately 3 to 4 hours ago. Difficult history to obtain as patient is reporting inconsistent symptoms.   Review of Systems  Positive: Poison in her meth, weakness Negative: SI, HI, AVH  Physical Exam  BP 112/85 (BP Location: Left Arm)   Pulse (!) 101   Temp (!) 97.5 F (36.4 C)   Resp 17   SpO2 100%  Gen:   Awake, no distress   Resp:  Normal effort  MSK:   Moves extremities without difficulty Other:    Medical Decision Making  Medically screening exam initiated at 8:50 AM.  Appropriate orders placed.  Miosotis Laduke was informed that the remainder of the evaluation will be completed by another provider, this initial triage assessment does not replace that evaluation, and the importance of remaining in the ED until their evaluation is complete.  Orders: ED medical clearance labs    Janetta Terrall FALCON, PA-C 03/29/24 9146

## 2024-03-29 NOTE — ED Triage Notes (Signed)
 Pt states she was smoking meth yesterday and feels like it was bad meth and it had toxins in it. Pt states she is light headed, puss coming from eyes weakness, SOB all starting since she smoked yesterday.

## 2024-04-16 NOTE — ED Provider Notes (Signed)
 Patient placed in First Look pathway, seen and evaluated for chief complaint of left foot burning and pain without in injury. Also notes queasy after eating a burger but denies any GI sx. Pertinent exam findings include non-toxic in appearance and ambulates with a steady gait. Based on initial evaluation, labs are not currently indicated and radiology studies are not currently indicated as allowed for current processes and treatments as applicable in a triage setting and could be different than if patient were seen in a main treatment area or dependent on labs/imagining after results are displayed.  Patient counseled on process, plan, and necessity for staying for completing the evaluation.   This document serves as a record of services personally performed by Tamea Ford PA-C.    [facility] Emergency Department Emergency Department Provider Note  This document was created using the aid of voice recognition Dragon dictation software.   Provider at bedside: 04/17/2024 7:29 PM  History obtained from the: Patient  History   Chief Complaint  Patient presents with   Nausea   Foot Injury     History provided by:  Patient Language interpreter used: No     Cheryl Baxter is a 34 y.o. female who presents to the ED with complaints of sore throat, body aches, cough, congestion and nausea which began yesterday but worsened earlier today.  She does report multiple sick contact exposures including her children who are now sick with similar symptoms.  She also mentions that she noticed increased pain in her left toe earlier today which she describes as a burning sensation though this has resolved by time of my evaluation.  No history of diabetes or neuropathy.  No preceding trauma or injury.  She denies vomiting, diarrhea and no other reported complaints ______________________ ROS: Pertinent positives and negatives per HPI.  Physical Exam   Vitals:   04/16/24 1400 04/16/24 1657   BP: 123/74 (!) 160/90  BP Location: Right arm Right arm  Patient Position: Sitting Sitting  Pulse: 83 91  Resp: 16 17  Temp: 98.8 F (37.1 C) 98.5 F (36.9 C)  TempSrc: Oral Oral  SpO2: 98% 97%  Weight: 108 kg (238 lb)   Height: 154.9 cm (5' 1)     Physical Exam Constitutional:      Appearance: Normal appearance. She is not toxic-appearing.  HENT:     Head: Normocephalic and atraumatic.     Comments: Speaking in complete sentences without difficulty.  No trismus, floor of mouth is soft, no drooling or stridor    Mouth/Throat:     Pharynx: Uvula midline. Posterior oropharyngeal erythema (mild) present.     Tonsils: No tonsillar exudate.  Cardiovascular:     Rate and Rhythm: Normal rate and regular rhythm.  Pulmonary:     Effort: Pulmonary effort is normal.     Breath sounds: Normal breath sounds.  Abdominal:     Palpations: Abdomen is soft.     Tenderness: There is no abdominal tenderness.  Feet:     Comments: No tenderness to palpation throughout the left foot including in the left great toe.  Left great toe is warm and well-perfused with capillary refill less than 3 seconds, 2+ DP and PT pulses on the left Skin:    General: Skin is warm and dry.  Neurological:     General: No focal deficit present.     Mental Status: She is alert.  Psychiatric:        Mood and Affect: Mood normal.  Results    ED Course     Medical Decision Making    DDX: Influenza, Covid 19, pharyngitis, pneumonia, bronchitis,  other viral URI   Clinical Complexity  Patient's presentation is most consistent with acute presentation with potential threat to life or bodily function.  Patient's Impaired access to primary care increases the complexity of managing their  presentation with URI symptoms.    Provider time spent in patient care today, inclusive of but not limited to clinical reassessment, review of diagnostic studies, and discharge preparation, was greater than 30 minutes.    All Imaging and lab work personally viewed and interpreted by myself. My interpretations are as follow:  Patient presenting to the ED with symptoms as above.  Upon arrival, she is well-appearing with reassuring vital signs.  She initially wanted to obtain strep testing though she has no exudates and centor score is 0 points with low probably of strep pharyngitis.  I did order strep testing however, the patient was then eager for discharge and stated that she did not want this.  Symptoms likely viral in nature and discussed with patient that viral swabs would not change management at this time. As she is overall well-appearing, she appears stable for discharge with medication for symptomatic management at home.  Discussed strict return precautions  Medical Decision Making Problems Addressed: Viral URI: complicated acute illness or injury  Amount and/or Complexity of Data Reviewed Labs: ordered.  Risk Prescription drug management.    ED Clinical Impression   1. Viral URI    FOLLOW UP No follow-up provider specified.  ED Disposition     ED Disposition  Discharge   Condition  Stable   Comment  --        _____________________________
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
## 2024-05-06 ENCOUNTER — Telehealth (HOSPITAL_COMMUNITY): Payer: MEDICAID

## 2024-05-06 DIAGNOSIS — F315 Bipolar disorder, current episode depressed, severe, with psychotic features: Secondary | ICD-10-CM

## 2024-05-06 DIAGNOSIS — F4310 Post-traumatic stress disorder, unspecified: Secondary | ICD-10-CM | POA: Diagnosis not present

## 2024-05-06 DIAGNOSIS — F411 Generalized anxiety disorder: Secondary | ICD-10-CM

## 2024-05-06 MED ORDER — HYDROXYZINE HCL 10 MG PO TABS: 10.0000 mg | ORAL_TABLET | Freq: Three times a day (TID) | ORAL | 1 refills | Status: AC | PRN

## 2024-05-06 MED ORDER — LAMOTRIGINE 25 MG PO TABS: 25.0000 mg | ORAL_TABLET | Freq: Every day | ORAL | 1 refills | Status: AC

## 2024-05-06 MED ORDER — PRAZOSIN HCL 1 MG PO CAPS: 1.0000 mg | ORAL_CAPSULE | Freq: Every day | ORAL | 1 refills | Status: AC

## 2024-05-06 MED ORDER — BUSPIRONE HCL 7.5 MG PO TABS: 7.5000 mg | ORAL_TABLET | Freq: Two times a day (BID) | ORAL | 2 refills | Status: AC

## 2024-05-12 ENCOUNTER — Encounter (HOSPITAL_COMMUNITY): Payer: Self-pay | Admitting: Pharmacy Technician

## 2024-05-12 ENCOUNTER — Other Ambulatory Visit: Payer: Self-pay

## 2024-05-12 ENCOUNTER — Emergency Department (HOSPITAL_COMMUNITY)
Admission: EM | Admit: 2024-05-12 | Discharge: 2024-05-12 | Disposition: A | Discharge status: Home / Self Care | Payer: MEDICAID

## 2024-05-12 ENCOUNTER — Emergency Department (HOSPITAL_COMMUNITY): Admission: EM | Admit: 2024-05-12 | Discharge: 2024-05-13 | Disposition: A | Payer: MEDICAID | Source: Home / Self Care

## 2024-05-12 ENCOUNTER — Encounter (HOSPITAL_COMMUNITY): Payer: Self-pay | Admitting: *Deleted

## 2024-05-12 DIAGNOSIS — R519 Headache, unspecified: Secondary | ICD-10-CM | POA: Insufficient documentation

## 2024-05-12 DIAGNOSIS — R51 Headache with orthostatic component, not elsewhere classified: Secondary | ICD-10-CM

## 2024-05-12 DIAGNOSIS — R002 Palpitations: Secondary | ICD-10-CM | POA: Diagnosis present

## 2024-05-12 DIAGNOSIS — A6004 Herpesviral vulvovaginitis: Secondary | ICD-10-CM | POA: Diagnosis not present

## 2024-05-12 LAB — HCG, SERUM, QUALITATIVE: Preg, Serum: NEGATIVE

## 2024-05-12 LAB — RAPID HIV SCREEN (HIV 1/2 AB+AG)
HIV 1/2 Antibodies: NONREACTIVE
HIV-1 P24 Antigen - HIV24: NONREACTIVE

## 2024-05-12 MED ORDER — VALACYCLOVIR HCL 500 MG PO TABS: 500.0000 mg | ORAL_TABLET | Freq: Two times a day (BID) | ORAL | 0 refills | Status: AC

## 2024-05-12 NOTE — ED Triage Notes (Signed)
 The pt arrived by gems from home  she was already seen here in the ed earlier today  ems reports that she was sitting with her pulse  ox on her finger reporting that it was low  sats 98-100 here

## 2024-05-12 NOTE — Discharge Instructions (Addendum)
" °  Follow-up with [facility] for PCP.  I have prescribed valacyclovir  for herpes.  I gave you a list of dentists. Give them a call.   The PCP that can see you is the [facility]   Your HIV test results were negative  "

## 2024-05-12 NOTE — ED Triage Notes (Signed)
 Pt bib ptar from home with headache X1 hour, has not tried OTC relief. HR this morning was 59 so was wanting evaluation for that. VSS throughout transport.

## 2024-05-12 NOTE — ED Provider Triage Note (Signed)
 Emergency Medicine Provider Triage Evaluation Note  Cheryl Baxter , a 34 y.o. female  was evaluated in triage.  Pt complains of was seen earlier today, concern for low heart rate.  States that at 1 point it was 50.SABRA  Review of Systems  Positive: Heart rate concern Negative:   Physical Exam  Ht 5' 6 (1.676 m)   Wt 106.2 kg   BMI 37.79 kg/m  Gen:   Awake, no distress   Resp:  Normal effort  MSK:   Moves extremities without difficulty  Other:    Medical Decision Making  Medically screening exam initiated at 11:37 PM.  Appropriate orders placed.  Cheryl Baxter was informed that the remainder of the evaluation will be completed by another provider, this initial triage assessment does not replace that evaluation, and the importance of remaining in the ED until their evaluation is complete.  EKG ordered   Francis Ileana SAILOR, PA-C 05/12/24 2339

## 2024-05-12 NOTE — ED Provider Notes (Signed)
 " [facility] Provider Note   CSN: [id number] Arrival date & time: 05/12/24  9091     Patient presents with: No chief complaint on file.   Cheryl Baxter is a 34 y.o. female.   HPI  Patient presents because of headache.  Patient states that she woke up and got up too fast and felt like she may be developing a headache.  She is currently feeling fine now.  No complaints.  No headaches.  No vision changes.  No numbness or tingling aware.  No trauma no falls.  States her headache is completely resolved.  Also states that she might be having a breakout of herpes.  Asking for treatment for this.  She states that she has a chronic diagnosis.  Currently not on any kind of medication but has temporary outbreaks.  Also states that she needs to follow-up with dentistry and have a hard time finding a dentist that takes her insurance.  Endorsing some generalized mouth pain.  No fever no chills.  No obvious oral swelling that she endorses.  Also asking for weight loss drug.  No chest pain shortness breath.  No fever no chills.  No nausea vomit diarrhea.  No vaginal discharge.  States that she is not sexually active at this moment.  Patient states that she think she had a tubal ligation in the past.     Previous medical history reviewed : Last seen in the ED and January 2026.  Foot pain and nausea.  Unremarkable workup.  Viral URI   Prior to Admission medications  Medication Sig Start Date End Date Taking? Authorizing Provider  valACYclovir  (VALTREX ) 500 MG tablet Take 1 tablet (500 mg total) by mouth 2 (two) times daily for 3 days. 05/12/24 05/15/24 Yes Simon Lavonia SAILOR, MD  acetaminophen  (TYLENOL ) 325 MG tablet Take 650 mg by mouth every 6 (six) hours as needed (Pain).    [provider]  albuterol  (VENTOLIN  HFA) 108 (90 Base) MCG/ACT inhaler Inhale 1 puff into the lungs every 4 (four) hours as needed for wheezing or shortness of breath. 10/04/23    Blair, Christal H, NP  busPIRone  (BUSPAR ) 7.5 MG tablet Take 1 tablet (7.5 mg total) by mouth 2 (two) times daily. 05/06/24   Kapoor, Sahil, MD  hydrOXYzine  (ATARAX ) 10 MG tablet Take 1 tablet (10 mg total) by mouth 3 (three) times daily as needed for anxiety. 05/06/24   Kapoor, Sahil, MD  lamoTRIgine  (LAMICTAL ) 25 MG tablet Take 1 tablet (25 mg total) by mouth daily. 05/06/24   Kapoor, Sahil, MD  potassium chloride  SA (KLOR-CON  M) 20 MEQ tablet Take 1 tablet (20 mEq total) by mouth daily for 5 days. 02/25/24 03/01/24  Aberman, Caroline C, PA-C  prazosin  (MINIPRESS ) 1 MG capsule Take 1 capsule (1 mg total) by mouth at bedtime. 05/06/24   Kapoor, Sahil, MD  QUEtiapine  (SEROQUEL ) 100 MG tablet Take 1 tablet (100 mg total) by mouth daily. Patient taking differently: Take 100 mg by mouth See admin instructions. Take 3 tablets (300 mg) PO in morning and 3 tablets (300 mg) PO QHS 10/05/23   Bennett, Christal H, NP  QUEtiapine  (SEROQUEL ) 300 MG tablet Take 1 tablet (300 mg total) by mouth at bedtime. 01/27/24   Lenard Calin, MD    Allergies: Penicillins    Review of Systems  Constitutional:  Negative for chills and fever.  HENT:  Negative for ear pain and sore throat.   Eyes:  Negative for pain  and visual disturbance.  Respiratory:  Negative for cough and shortness of breath.   Cardiovascular:  Negative for chest pain and palpitations.  Gastrointestinal:  Negative for abdominal pain and vomiting.  Genitourinary:  Negative for dysuria and hematuria.  Musculoskeletal:  Negative for arthralgias and back pain.  Skin:  Negative for color change and rash.  Neurological:  Negative for seizures and syncope.  All other systems reviewed and are negative.   Updated Vital Signs BP 116/81 (BP Location: Left Arm)   Pulse 70   Temp 98.7 F (37.1 C) (Oral)   Resp 17   SpO2 100%   Physical Exam Vitals and nursing note reviewed.  Constitutional:      General: She is not in acute distress.     Appearance: She is well-developed.  HENT:     Head: Normocephalic and atraumatic.  Eyes:     Conjunctiva/sclera: Conjunctivae normal.  Cardiovascular:     Rate and Rhythm: Normal rate and regular rhythm.     Heart sounds: No murmur heard. Pulmonary:     Effort: Pulmonary effort is normal. No respiratory distress.     Breath sounds: Normal breath sounds.  Abdominal:     Palpations: Abdomen is soft.     Tenderness: There is no abdominal tenderness.  Musculoskeletal:        General: No swelling.     Cervical back: Neck supple.  Skin:    General: Skin is warm and dry.     Capillary Refill: Capillary refill takes less than 2 seconds.  Neurological:     Mental Status: She is alert.  Psychiatric:        Mood and Affect: Mood normal.     (all labs ordered are listed, but only abnormal results are displayed) Labs Reviewed  HCG, SERUM, QUALITATIVE  RAPID HIV SCREEN (HIV 1/2 AB+AG)    EKG: EKG Interpretation Date/Time:  Wednesday May 12 2024 10:12:17 EST Ventricular Rate:  75 PR Interval:  154 QRS Duration:  84 QT Interval:  394 QTC Calculation: 439 R Axis:   71  Text Interpretation: Normal sinus rhythm with sinus arrhythmia Abnormal ECG When compared with ECG of 29-Mar-2024 09:01, PREVIOUS ECG IS PRESENT Confirmed by Simon Rea [phone]) on 05/12/2024 10:47:28 AM  Radiology: No results found.   Procedures   Medications Ordered in the ED - No data to display                                  Medical Decision Making Amount and/or Complexity of Data Reviewed Labs: ordered.  Risk Prescription drug management.     HPI:   Patient presents because of headache.  Patient states that she woke up and got up too fast and felt like she may be developing a headache.  She is currently feeling fine now.  No complaints.  No headaches.  No vision changes.  No numbness or tingling aware.  No trauma no falls.  States her headache is completely resolved.  Also states that she  might be having a breakout of herpes.  Asking for treatment for this.  She states that she has a chronic diagnosis.  Currently not on any kind of medication but has temporary outbreaks.  Also states that she needs to follow-up with dentistry and have a hard time finding a dentist that takes her insurance.  Endorsing some generalized mouth pain.  No fever no chills.  No obvious oral  swelling that she endorses.  Also asking for weight loss drug.  No chest pain shortness breath.  No fever no chills.  No nausea vomit diarrhea.  No vaginal discharge.  States that she is not sexually active at this moment.  Patient states that she think she had a tubal ligation in the past.   Previous medical history reviewed : Last seen in the ED and January 2026.  Foot pain and nausea.  Unremarkable workup.  Viral URI   MDM:   Upon examination, patient hemodynamically stable. A&O x 3 with GCS 15.  In terms of headache.  Patient neurologically intact.  A and O x 3 GCS 15.  NIH is 0.  No concerns for pseudotumor cerebri.  No vision changes.  No concerns for subdural epidural.  No falls.  No indication for imaging.  In terms of patient's genital herpes.  Will prescribe oral antiviral.  No vaginal discharge.  Offered pregnancy test.  Patient declined.  States that she is not sexually active and has had a tubal ligation in the past.  Understands that if there is a chance that she is pregnant while taking this medication that there could be complications.  She states that she does not want to produce a urine sample.  Asking for weight loss medication.  Will refer to PCP.  Asking for referral to dentistry.  Will give her numbers.  No indication for labs   Reevaluation:   Upon reexamination, patient hemodynamically stable.  Remains A&O x 3 with GCS 15.  Serum hCG negative.  Started on valacyclovir   Provided information to both dentistry as well as PCP.  Social Determinant of Health: No PCP    Disposition  and Follow Up: PCP     Final diagnoses:  Herpes simplex vulvovaginitis  Orthostatic headache    ED Discharge Orders          Ordered    valACYclovir  (VALTREX ) 500 MG tablet  2 times daily        05/12/24 1006               Simon Lavonia SAILOR, MD 05/12/24 1126  "

## 2024-05-13 ENCOUNTER — Encounter: Payer: Self-pay | Admitting: Cardiology

## 2024-05-13 ENCOUNTER — Ambulatory Visit: Payer: MEDICAID | Admitting: Student

## 2024-05-13 ENCOUNTER — Ambulatory Visit: Payer: MEDICAID | Attending: Cardiology | Admitting: Cardiology

## 2024-05-13 VITALS — BP 113/79 | HR 99 | Ht 61.0 in | Wt 230.9 lb

## 2024-05-13 DIAGNOSIS — R002 Palpitations: Secondary | ICD-10-CM | POA: Insufficient documentation

## 2024-05-13 MED ORDER — ALBUTEROL SULFATE HFA 108 (90 BASE) MCG/ACT IN AERS
1.0000 | INHALATION_SPRAY | RESPIRATORY_TRACT | Status: DC | PRN
  Administered 2024-05-13: 2 via RESPIRATORY_TRACT

## 2024-05-13 MED ORDER — ALBUTEROL SULFATE HFA 108 (90 BASE) MCG/ACT IN AERS
INHALATION_SPRAY | RESPIRATORY_TRACT | Status: AC
  Filled 2024-05-13: qty 6.7

## 2024-05-13 NOTE — ED Provider Notes (Signed)
 " [name] Provider Note   CSN: [id number] Arrival date & time: 05/12/24  2353     Patient presents with: No chief complaint on file.   Cheryl Baxter is a 34 y.o. female.   Pt is a 33 yo female with pmhx significant for anemia, polysubstance abuse and bipolar disorder.  She presents to the ED today because she was at home and her HR went up to 175.  She said she was just sitting and watching TV.  She tried to show me on her blood pressure machine, but she did not have any HR readings over 80.  She said she felt dizzy at the time which is why she checked it, but feels well now.  Of note, pt was just here yesterday wanting a rx for Valtrex  which was sent in to her pharmacy.       Prior to Admission medications  Medication Sig Start Date End Date Taking? Authorizing Provider  acetaminophen  (TYLENOL ) 325 MG tablet Take 650 mg by mouth every 6 (six) hours as needed (Pain).    [provider]  albuterol  (VENTOLIN  HFA) 108 (90 Base) MCG/ACT inhaler Inhale 1 puff into the lungs every 4 (four) hours as needed for wheezing or shortness of breath. 10/04/23   Blair, Christal H, NP  busPIRone  (BUSPAR ) 7.5 MG tablet Take 1 tablet (7.5 mg total) by mouth 2 (two) times daily. 05/06/24   Kapoor, Sahil, MD  hydrOXYzine  (ATARAX ) 10 MG tablet Take 1 tablet (10 mg total) by mouth 3 (three) times daily as needed for anxiety. 05/06/24   Kapoor, Sahil, MD  lamoTRIgine  (LAMICTAL ) 25 MG tablet Take 1 tablet (25 mg total) by mouth daily. 05/06/24   Kapoor, Sahil, MD  potassium chloride  SA (KLOR-CON  M) 20 MEQ tablet Take 1 tablet (20 mEq total) by mouth daily for 5 days. 02/25/24 03/01/24  Aberman, Caroline C, PA-C  prazosin  (MINIPRESS ) 1 MG capsule Take 1 capsule (1 mg total) by mouth at bedtime. 05/06/24   Kapoor, Sahil, MD  QUEtiapine  (SEROQUEL ) 100 MG tablet Take 1 tablet (100 mg total) by mouth daily. Patient taking differently: Take 100 mg by mouth See  admin instructions. Take 3 tablets (300 mg) PO in morning and 3 tablets (300 mg) PO QHS 10/05/23   Bennett, Christal H, NP  QUEtiapine  (SEROQUEL ) 300 MG tablet Take 1 tablet (300 mg total) by mouth at bedtime. 01/27/24   Lenard Calin, MD  valACYclovir  (VALTREX ) 500 MG tablet Take 1 tablet (500 mg total) by mouth 2 (two) times daily for 3 days. 05/12/24 05/15/24  Simon Lavonia SAILOR, MD    Allergies: Penicillins    Review of Systems  Cardiovascular:  Positive for palpitations.  All other systems reviewed and are negative.   Updated Vital Signs BP 133/80 (BP Location: Right Arm)   Pulse 84   Temp 98.1 F (36.7 C) (Oral)   Resp 16   Ht 5' 6 (1.676 m)   Wt 106.2 kg   SpO2 100%   BMI 37.79 kg/m   Physical Exam Vitals and nursing note reviewed.  Constitutional:      Appearance: Normal appearance. She is obese.  HENT:     Head: Normocephalic and atraumatic.     Right Ear: External ear normal.     Left Ear: External ear normal.     Nose: Nose normal.     Mouth/Throat:     Mouth: Mucous membranes are moist.     Pharynx: Oropharynx  is clear.  Eyes:     Extraocular Movements: Extraocular movements intact.     Conjunctiva/sclera: Conjunctivae normal.     Pupils: Pupils are equal, round, and reactive to light.  Cardiovascular:     Rate and Rhythm: Normal rate and regular rhythm.     Pulses: Normal pulses.     Heart sounds: Normal heart sounds.  Pulmonary:     Effort: Pulmonary effort is normal.     Breath sounds: Normal breath sounds.  Abdominal:     General: Abdomen is flat. Bowel sounds are normal.     Palpations: Abdomen is soft.  Musculoskeletal:        General: Normal range of motion.     Cervical back: Normal range of motion and neck supple.  Skin:    General: Skin is warm.     Capillary Refill: Capillary refill takes less than 2 seconds.  Neurological:     General: No focal deficit present.     Mental Status: She is alert and oriented to person, place, and time.   Psychiatric:        Mood and Affect: Mood normal.        Behavior: Behavior normal.     (all labs ordered are listed, but only abnormal results are displayed) Labs Reviewed - No data to display  EKG: EKG Interpretation Date/Time:  Thursday May 13 2024 00:14:05 EST Ventricular Rate:  84 PR Interval:  166 QRS Duration:  76 QT Interval:  364 QTC Calculation: 430 R Axis:   109  Text Interpretation: Normal sinus rhythm Rightward axis Borderline ECG When compared with ECG of 12-May-2024 10:12, No significant change was found Confirmed by Raford Lenis ([id number]) on 05/13/2024 2:59:11 AM  Radiology: No results found.   Procedures   Medications Ordered in the ED  albuterol  (VENTOLIN  HFA) 108 (90 Base) MCG/ACT inhaler 1-2 puff (2 puffs Inhalation Given 05/13/24 0838)  albuterol  (VENTOLIN  HFA) 108 (90 Base) MCG/ACT inhaler (has no administration in time range)                                    Medical Decision Making  This patient presents to the ED for concern of palpitations, this involves an extensive number of treatment options, and is a complaint that carries with it a high risk of complications and morbidity.  The differential diagnosis includes electrolyte abn, infection, drug use, tachycardia, machine error   Co morbidities that complicate the patient evaluation  anemia, polysubstance abuse and bipolar disorder   Additional history obtained:  Additional history obtained from epic chart review    Medicines ordered and prescription drug management:  I have reviewed the patients home medicines and have made adjustments as needed   Problem List / ED Course:  Tachycardia:  pt does not have any evidence of tachycardia on her machine.  She has been here overnight and has been stable.  I think she can f/u with cards.  Return if worse.  She requested an albuterol  inhaler, so she's given that in the ED.   Reevaluation:  After the interventions noted above, I  reevaluated the patient and found that they have :improved   Social Determinants of Health:  Lives at home   Dispostion:  After consideration of the diagnostic results and the patients response to treatment, I feel that the patent would benefit from discharge with outpatient f/u.       Final  diagnoses:  Palpitations    ED Discharge Orders          Ordered    Ambulatory referral to Cardiology        05/13/24 0818               Dean Clarity, MD 05/13/24 [phone]  "

## 2024-05-13 NOTE — Patient Instructions (Signed)
 Medication Instructions:  Your physician recommends that you continue on your current medications as directed. Please refer to the Current Medication list given to you today.  *If you need a refill on your cardiac medications before your next appointment, please call your pharmacy*  Lab Work: NONE If you have labs (blood work) drawn today and your tests are completely normal, you will receive your results only by: MyChart Message (if you have MyChart) OR A paper copy in the mail If you have any lab test that is abnormal or we need to change your treatment, we will call you to review the results.  Testing/Procedures: 2 Week Zio Heart Monitor Your physician has requested that you wear a Zio heart monitor for _14____ days. This will be mailed to your home with instructions on how to apply the monitor and how to return it when finished. Please allow 2 weeks after returning the heart monitor before our office calls you with the results.   Follow-Up: At [facility], you and your health needs are our priority.  As part of our continuing mission to provide you with exceptional heart care, our providers are all part of one team.  This team includes your primary Cardiologist (physician) and Advanced Practice Providers or APPs (Physician Assistants and Nurse Practitioners) who all work together to provide you with the care you need, when you need it.  Your next appointment:   1 month(s)  Provider:   One of our Advanced Practice Providers (APPs): Morse Clause, PA-C  Hanh Waddell Daniels, PA-C  Saddie Cleaves, NP  Olivia Pavy, PA-C Miriam Shams, NP  Leontine Salen, PA-C Josefa Beauvais, NP  [facility], PA-C [place], PA-C  [place], PA-C [place], [place]  Damien Braver, NP Jon Hails, PA-C  Waddell Donath, PA-C Dayna Dunn, PA-C  [place], PA-C [place], [place] Glendia Ferrier, PA-C Callie Goodrich, PA-C  Katlyn West, NP Thom Sluder, PA-C  Alyssa White, NP Rollo Louder,  PA-C Xika Zhao, NP    Lamarr Satterfield, NP             We recommend signing up for the patient portal called MyChart.  Sign up information is provided on this After Visit Summary.  MyChart is used to connect with patients for Virtual Visits (Telemedicine).  Patients are able to view lab/test results, encounter notes, upcoming appointments, etc.  Non-urgent messages can be sent to your provider as well.   To learn more about what you can do with MyChart, go to [email or web address].   Other Instructions  ZIO XT- Long Term Monitor Instructions  Your physician has requested you wear a ZIO patch monitor for 14 days.  This is a single patch monitor. [employer] supplies one patch monitor per enrollment. Additional stickers are not available. Please do not apply patch if you will be having a Nuclear Stress Test,  Echocardiogram, Cardiac CT, MRI, or Chest Xray during the period you would be wearing the  monitor. The patch cannot be worn during these tests. You cannot remove and re-apply the  ZIO XT patch monitor.  Your ZIO patch monitor will be mailed 3 day USPS to your address on file. It may take 3-5 days  to receive your monitor after you have been enrolled.  Once you have received your monitor, please review the enclosed instructions. Your monitor  has already been registered assigning a specific monitor serial # to you.  Billing and Patient Assistance Program Information  We have supplied [employer] with any of  your insurance information on file for billing purposes. [employer] offers a sliding scale Patient Assistance Program for patients that do not have  insurance, or whose insurance does not completely cover the cost of the ZIO monitor.  You must apply for the Patient Assistance Program to qualify for this discounted rate.  To apply, please call [employer] at [phone], select option 4, select option 2, ask to apply for  Patient Assistance Program. Meredeth will ask your household income,  and how many people  are in your household. They will quote your out-of-pocket cost based on that information.  [employer] will also be able to set up a 40-month, interest-free payment plan if needed.  Applying the monitor   Shave hair from upper left chest.  Hold abrader disc by orange tab. Rub abrader in 40 strokes over the upper left chest as  indicated in your monitor instructions.  Clean area with 4 enclosed alcohol pads. Let dry.  Apply patch as indicated in monitor instructions. Patch will be placed under collarbone on left  side of chest with arrow pointing upward.  Rub patch adhesive wings for 2 minutes. Remove white label marked 1. Remove the white  label marked 2. Rub patch adhesive wings for 2 additional minutes.  While looking in a mirror, press and release button in center of patch. A small green light will  flash 3-4 times. This will be your only indicator that the monitor has been turned on.  Do not shower for the first 24 hours. You may shower after the first 24 hours.  Press the button if you feel a symptom. You will hear a small click. Record Date, Time and  Symptom in the Patient Logbook.  When you are ready to remove the patch, follow instructions on the last 2 pages of Patient  Logbook. Stick patch monitor onto the last page of Patient Logbook.  Place Patient Logbook in the blue and white box. Use locking tab on box and tape box closed  securely. The blue and white box has prepaid postage on it. Please place it in the mailbox as  soon as possible. Your physician should have your test results approximately 7 days after the  monitor has been mailed back to [facility].  Call [facility] Customer Care at [phone] if you have questions regarding  your ZIO XT patch monitor. Call them immediately if you see an orange light blinking on your  monitor.  If your monitor falls off in less than 4 days, contact our Monitor department at [phone].  If your monitor  becomes loose or falls off after 4 days call [employer] at [phone] for  suggestions on securing your monitor

## 2024-05-13 NOTE — Progress Notes (Signed)
 "    Cardiology Office Note   Date:  05/13/2024   ID:  Cheryl Baxter, DOB May 15, 1990, MRN [id number]  PCP:  Patient, No Pcp Per  Cardiologist:   None Referring:  ED  Chief Complaint  Patient presents with   Palpitations      History of Present Illness: Cheryl Baxter is a 34 y.o. female who presents for evaluation of palpitations.  She was in the emergency room yesterday for this.  I reviewed these records for this visit.  She reported at home that her heart rate went up above 175.  She was monitored overnight and there were no arrhythmias noted.  EKG was unremarkable.    She said that initially her heart rate at home went into the 50s.  She started to get panicked.  Her heart rate then went to 175.  She did call EMS and I reviewed these records.  Her heart rate went to the 130s.  It slowly came down over time.  In the emergency room there were no significant arrhythmias noted.  No specific therapy was indicated.  I do see labs on ER visits have been unremarkable including TSH earlier this year.  She has not had any prior cardiac history.  She has not had any prior cardiac testing.  She walks up 15 stairs routinely and can do this without shortness of breath or chest discomfort.  She has not had any frank syncope.  She did feel a little lightheaded with her heart racing and felt woozy.  Her legs felt weak.  This resolved slowly prior to getting to the ER.   Past Medical History:  Diagnosis Date   Anemia    Herpes genitalia    HIV exposure 12/2015    Past Surgical History:  Procedure Laterality Date   NO PAST SURGERIES       Current Outpatient Medications  Medication Sig Dispense Refill   acetaminophen  (TYLENOL ) 325 MG tablet Take 650 mg by mouth every 6 (six) hours as needed (Pain).     albuterol  (VENTOLIN  HFA) 108 (90 Base) MCG/ACT inhaler Inhale 1 puff into the lungs every 4 (four) hours as needed for wheezing or shortness of breath. 8.5 each 0   busPIRone  (BUSPAR ) 7.5 MG  tablet Take 1 tablet (7.5 mg total) by mouth 2 (two) times daily. 60 tablet 2   hydrOXYzine  (ATARAX ) 10 MG tablet Take 1 tablet (10 mg total) by mouth 3 (three) times daily as needed for anxiety. 75 tablet 1   lamoTRIgine  (LAMICTAL ) 25 MG tablet Take 1 tablet (25 mg total) by mouth daily. 30 tablet 1   potassium chloride  SA (KLOR-CON  M) 20 MEQ tablet Take 1 tablet (20 mEq total) by mouth daily for 5 days. 5 tablet 0   prazosin  (MINIPRESS ) 1 MG capsule Take 1 capsule (1 mg total) by mouth at bedtime. 30 capsule 1   QUEtiapine  (SEROQUEL ) 100 MG tablet Take 1 tablet (100 mg total) by mouth daily. (Patient taking differently: Take 100 mg by mouth See admin instructions. Take 3 tablets (300 mg) PO in morning and 3 tablets (300 mg) PO QHS) 30 tablet 0   QUEtiapine  (SEROQUEL ) 300 MG tablet Take 1 tablet (300 mg total) by mouth at bedtime.     valACYclovir  (VALTREX ) 500 MG tablet Take 1 tablet (500 mg total) by mouth 2 (two) times daily for 3 days. 6 tablet 0   No current facility-administered medications for this visit.    Allergies:   Penicillins  Social History:  The patient  reports that she has been smoking cigars. She has never used smokeless tobacco. She reports current alcohol use. She reports current drug use. Drug: Methamphetamines.   Family History:  The patient's family history is not on file.    ROS:  Please see the history of present illness.   Otherwise, review of systems are positive for none.   All other systems are reviewed and negative.    PHYSICAL EXAM: VS:  BP 113/79 (BP Location: Left Arm, Patient Position: Sitting, Cuff Size: Normal)   Pulse 99   Ht 5' 1 (1.549 m)   Wt 230 lb 14.4 oz (104.7 kg)   SpO2 99%   BMI 43.63 kg/m  , BMI Body mass index is 43.63 kg/m. GENERAL:  Well appearing HEENT:  Pupils equal round and reactive, fundi not visualized, oral mucosa unremarkable NECK:  No jugular venous distention, waveform within normal limits, carotid upstroke brisk and  symmetric, no bruits, no thyromegaly LYMPHATICS:  No cervical, inguinal adenopathy LUNGS:  Clear to auscultation bilaterally BACK:  No CVA tenderness CHEST:  Unremarkable HEART:  PMI not displaced or sustained,S1 and S2 within normal limits, no S3, no S4, no clicks, no rubs, no murmurs ABD:  Flat, positive bowel sounds normal in frequency in pitch, no bruits, no rebound, no guarding, no midline pulsatile mass, no hepatomegaly, no splenomegaly EXT:  2 plus pulses throughout, no edema, no cyanosis no clubbing SKIN:  No rashes no nodules NEURO:  Cranial nerves II through XII grossly intact, motor grossly intact throughout PSYCH:  Cognitively intact, oriented to person place and time    EKG:     Normal sinus rhythm, rate 84, right axis deviation, intervals within normal limits, no acute ST-T wave changes.    Recent Labs: 09/25/2023: Magnesium  2.1 01/26/2024: TSH 0.553 03/29/2024: ALT 14; BUN 5; Creatinine, Ser 0.87; Hemoglobin 14.4; Platelets 384; Potassium 3.3; Sodium 137    Lipid Panel    Component Value Date/Time   CHOL 199 09/28/2023 0622   TRIG 83 09/28/2023 0622   HDL 71 09/28/2023 0622   CHOLHDL 2.8 09/28/2023 0622   VLDL 17 09/28/2023 0622   LDLCALC 111 (H) 09/28/2023 0622      Wt Readings from Last 3 Encounters:  05/13/24 230 lb 14.4 oz (104.7 kg)  05/12/24 234 lb 2.1 oz (106.2 kg)  03/10/24 225 lb (102.1 kg)      Other studies Reviewed: Additional studies/ records that were reviewed today include: ED records, labs. Review of the above records demonstrates:  Please see elsewhere in the note.     ASSESSMENT AND PLAN:  Palpitations: Patient most likely had sinus tachycardia.  She reports getting significantly anxious.  She has no significant cardiovascular risk factors.  Labs have been unremarkable.  I am going to start with a 2-week monitor.  Will have her come back in about a month to review this and see if she has any further symptoms.   Current medicines are  reviewed at length with the patient today.  The patient does not have concerns regarding medicines.  The following changes have been made:  no change  Labs/ tests ordered today include:   Orders Placed This Encounter  Procedures   LONG TERM MONITOR (3-14 DAYS)     Disposition:   FU with APP in 1 months.     Signed, Lynwood Schilling, MD  05/13/2024 5:27 PM    [facility]    "

## 2024-05-13 NOTE — Progress Notes (Unsigned)
" ° °  Cardiology [facility]:    Date:  05/13/2024   ID:  Cheryl Baxter, DOB 1990/08/23, MRN [id number]  PCP:  Patient, No Pcp Per  Cardiologist:  None { Click to update primary MD,subspecialty MD or APP then REFRESH:1}    Referring MD: Dean Clarity, MD   Chief Complaint: tachycardia  History of Present Illness:    Cheryl Baxter is a 34 y.o. female with a history of anxiety, depression with prior suicide attempts, bipolar I disorder, PTSD, and polysubstance abuse who presents today as a new patient in the [facility] for further evaluation of tachycardia.   Patient has had multiple ED visits since 01/2024 for anxiety, polysubstance abuse (including ectasy and methamphetamines). She was admitted to a behavioral health hospital in 01/2024 for methamphetamine use and passive suicidal ideation.   She was seen in the ED yesterday for headache and concerns about a herpes outbreak. She was started on Valacyclovir  and referred to a PCP. She then presented back to the ED early this morning for tachycardia. She reported her heart rate went up to 175 bpm at home while she was just sitting and watching TV. EKG showed normal sinus rhythm, rate 84 bpm, with no acute ischemic changes. No labs were checked. She was felt to be stable for discharge and was referred to Cardiology.  ***  EKGs/Labs/Other Studies Reviewed:    The following studies were reviewed:  N/A  EKG:  EKG not ordered. EKG from earlier today in the ED was personally reviewed and shows normal sinus rhythm, rate 84 bpm, with no acute ischemic changes.  Recent Labs: 09/25/2023: Magnesium  2.1 01/26/2024: TSH 0.553 03/29/2024: ALT 14; BUN 5; Creatinine, Ser 0.87; Hemoglobin 14.4; Platelets 384; Potassium 3.3; Sodium 137  Recent Lipid Panel    Component Value Date/Time   CHOL 199 09/28/2023 0622   TRIG 83 09/28/2023 0622   HDL 71 09/28/2023 0622   CHOLHDL 2.8 09/28/2023 0622   VLDL 17 09/28/2023 0622   LDLCALC 111 (H)  09/28/2023 0622    Physical Exam:    Vital Signs: There were no vitals taken for this visit.    Wt Readings from Last 3 Encounters:  05/12/24 234 lb 2.1 oz (106.2 kg)  03/10/24 225 lb (102.1 kg)  02/24/24 225 lb (102.1 kg)     General: 35 y.o. female in no acute distress. HEENT: Normocephalic and atraumatic. Sclera clear.  Neck: Supple. No carotid bruits. No JVD. Heart: *** RRR. Distinct S1 and S2. No murmurs, gallops, or rubs.  Lungs: No increased work of breathing. Clear to ausculation bilaterally. No wheezes, rhonchi, or rales.  Abdomen: Soft, non-distended, and non-tender to palpation.  Extremities: No lower extremity edema.  Radial and distal pedal pulses 2+ and equal bilaterally. Skin: Warm and dry. Neuro: No focal deficits. Psych: Normal affect. Responds appropriately.   Assessment:    No diagnosis found.  Plan:     Disposition: Follow up in ***   Signed, Aline FORBES Door, PA-C  05/13/2024 12:23 PM    [facility] "

## 2024-05-14 ENCOUNTER — Ambulatory Visit: Payer: MEDICAID | Attending: Cardiology

## 2024-05-14 DIAGNOSIS — R002 Palpitations: Secondary | ICD-10-CM

## 2024-05-14 NOTE — Progress Notes (Unsigned)
 Enrolled for [employer] to mail a ZIO XT long term holter monitor to the patients address on file.

## 2024-05-20 ENCOUNTER — Ambulatory Visit (INDEPENDENT_AMBULATORY_CARE_PROVIDER_SITE_OTHER): Payer: MEDICAID

## 2024-05-20 ENCOUNTER — Encounter (HOSPITAL_COMMUNITY): Payer: Self-pay

## 2024-05-20 DIAGNOSIS — F315 Bipolar disorder, current episode depressed, severe, with psychotic features: Secondary | ICD-10-CM
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
## 2024-06-03 ENCOUNTER — Ambulatory Visit (HOSPITAL_COMMUNITY): Payer: MEDICAID

## 2024-06-03 ENCOUNTER — Ambulatory Visit (HOSPITAL_BASED_OUTPATIENT_CLINIC_OR_DEPARTMENT_OTHER): Payer: MEDICAID | Admitting: Family Medicine

## 2024-06-25 ENCOUNTER — Ambulatory Visit: Payer: MEDICAID | Admitting: Cardiology

## 2024-07-01 ENCOUNTER — Telehealth (HOSPITAL_COMMUNITY): Payer: MEDICAID
